# Patient Record
Sex: Female | Born: 1935 | Race: Black or African American | Hispanic: No | Smoking: Never smoker
Health system: Southern US, Community
[De-identification: ages and names within clinical notes are randomized; demographics above are authoritative.]

## PROBLEM LIST (undated history)

## (undated) DIAGNOSIS — N10 Acute tubulo-interstitial nephritis: Secondary | ICD-10-CM

## (undated) DIAGNOSIS — R55 Syncope and collapse: Secondary | ICD-10-CM

## (undated) DIAGNOSIS — I4891 Unspecified atrial fibrillation: Secondary | ICD-10-CM

## (undated) DIAGNOSIS — I10 Essential (primary) hypertension: Secondary | ICD-10-CM

## (undated) DIAGNOSIS — I639 Cerebral infarction, unspecified: Secondary | ICD-10-CM

## (undated) DIAGNOSIS — E785 Hyperlipidemia, unspecified: Secondary | ICD-10-CM

## (undated) DIAGNOSIS — F0390 Unspecified dementia without behavioral disturbance: Secondary | ICD-10-CM

## (undated) HISTORY — PX: NO PAST SURGERIES: SHX2092

---
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
## 2018-01-05 ENCOUNTER — Emergency Department (HOSPITAL_COMMUNITY): Payer: Medicare Other

## 2018-01-05 ENCOUNTER — Encounter (HOSPITAL_COMMUNITY): Payer: Self-pay

## 2018-01-05 ENCOUNTER — Emergency Department (HOSPITAL_COMMUNITY)
Admission: EM | Admit: 2018-01-05 | Discharge: 2018-01-05 | Disposition: A | Discharge status: Home / Self Care | Payer: Medicare Other | Attending: Emergency Medicine | Admitting: Emergency Medicine

## 2018-01-05 ENCOUNTER — Other Ambulatory Visit: Payer: Self-pay

## 2018-01-05 DIAGNOSIS — I10 Essential (primary) hypertension: Secondary | ICD-10-CM | POA: Insufficient documentation

## 2018-01-05 DIAGNOSIS — Z79899 Other long term (current) drug therapy: Secondary | ICD-10-CM | POA: Insufficient documentation

## 2018-01-05 DIAGNOSIS — Z7982 Long term (current) use of aspirin: Secondary | ICD-10-CM | POA: Diagnosis not present

## 2018-01-05 DIAGNOSIS — R55 Syncope and collapse: Secondary | ICD-10-CM | POA: Insufficient documentation

## 2018-01-05 HISTORY — DX: Cerebral infarction, unspecified: I63.9

## 2018-01-05 HISTORY — DX: Unspecified atrial fibrillation: I48.91

## 2018-01-05 HISTORY — DX: Essential (primary) hypertension: I10

## 2018-01-05 HISTORY — DX: Hyperlipidemia, unspecified: E78.5

## 2018-01-05 LAB — TYPE AND SCREEN
ABO/RH(D): O NEG
Antibody Screen: NEGATIVE

## 2018-01-05 LAB — BASIC METABOLIC PANEL
ANION GAP: 6 (ref 5–15)
BUN: 20 mg/dL (ref 8–23)
CALCIUM: 8.8 mg/dL — AB (ref 8.9–10.3)
CO2: 26 mmol/L (ref 22–32)
Chloride: 110 mmol/L (ref 98–111)
Creatinine, Ser: 1.17 mg/dL — ABNORMAL HIGH (ref 0.44–1.00)
GFR calc Af Amer: 48 mL/min — ABNORMAL LOW (ref >60)
GFR calc non Af Amer: 41 mL/min — ABNORMAL LOW (ref >60)
GLUCOSE: 123 mg/dL — AB (ref 70–99)
POTASSIUM: 3.7 mmol/L (ref 3.5–5.1)
Sodium: 142 mmol/L (ref 135–145)

## 2018-01-05 LAB — CBC
HEMATOCRIT: 35.4 % — AB (ref 36.0–46.0)
HEMOGLOBIN: 11.2 g/dL — AB (ref 12.0–15.0)
MCH: 31 pg (ref 26.0–34.0)
MCHC: 31.6 g/dL (ref 30.0–36.0)
MCV: 98.1 fL (ref 80.0–100.0)
Platelets: 199 10*3/uL (ref 150–400)
RBC: 3.61 MIL/uL — AB (ref 3.87–5.11)
RDW: 13.4 % (ref 11.5–15.5)
WBC: 6.1 10*3/uL (ref 4.0–10.5)
nRBC: 0 % (ref 0.0–0.2)

## 2018-01-05 LAB — ABO/RH: ABO/RH(D): O NEG

## 2018-01-05 LAB — CBG MONITORING, ED: Glucose-Capillary: 87 mg/dL (ref 70–99)

## 2018-01-05 LAB — TROPONIN I: Troponin I: <0.03 ng/mL (ref <0.03)

## 2018-01-05 MED ORDER — SODIUM CHLORIDE 0.9 % IV BOLUS (SEPSIS)
500.0000 mL | Freq: Once | INTRAVENOUS | Status: AC
  Administered 2018-01-05: 500 mL via INTRAVENOUS

## 2018-01-05 MED ORDER — SODIUM CHLORIDE 0.9 % IV SOLN
1000.0000 mL | INTRAVENOUS | Status: DC
  Administered 2018-01-05: 1000 mL via INTRAVENOUS

## 2018-01-05 NOTE — ED Notes (Signed)
Patient transported to CT 

## 2018-01-05 NOTE — Discharge Instructions (Signed)
Make sure to stay hydrated.  Follow up with your doctor next week to be rechecked.  Return to the ED for any recurrent episodes, feeling weak, feverish, short of breath or other concerning symptoms

## 2018-01-05 NOTE — ED Notes (Signed)
Pt ambulated to and from restroom with steady gait.

## 2018-01-05 NOTE — ED Provider Notes (Signed)
[facility] EMERGENCY DEPARTMENT Provider Note   CSN: [id number] Arrival date & time: 01/05/18  1125     History   Chief Complaint Chief Complaint  Patient presents with  . Loss of Consciousness    HPI Sierra Burch is a 84 y.o. female.  HPI Patient presents to the emergency room for evaluation after a syncopal episode.  Patient was outside at the ENT home coming in during the festivities.  Family states she may not have had much to eat this morning but she was having some peaches and they were going to get her some additional food.  Patient started to get weak.  Patient started trembling all over.  She then slumped over to the side.  No seizure-like activity noted.  Patient regained consciousness in a minute or so.  He may have vomited after the episode.  Patient is now alert and awake and has no complaints.  She denies any headache.  No chest pain or abdominal pain.  No numbness or weakness.  She has had similar episodes in the past Past Medical History:  Diagnosis Date  . A-fib (HCC)   . Hyperlipidemia   . Hypertension   . Stroke [facility])     There are no active problems to display for this patient.   History reviewed. No pertinent surgical history.   OB History   None      Home Medications    Prior to Admission medications   Medication Sig Start Date End Date Taking? Authorizing Provider  alendronate (FOSAMAX) 70 MG tablet Take 70 mg by mouth once a week. 12/11/17  Yes [provider]  amLODipine (NORVASC) 10 MG tablet Take 10 mg by mouth daily. 12/11/17  Yes [provider]  apixaban (ELIQUIS) 2.5 MG TABS tablet Take 2.5 mg by mouth 2 (two) times daily.    Yes [provider]  aspirin 81 MG chewable tablet Chew 81 mg by mouth daily.   Yes [provider]  atorvastatin (LIPITOR) 20 MG tablet Take 20 mg by mouth daily. 12/27/17  Yes [provider]  cetirizine (ZYRTEC) 10 MG tablet Take 10 mg by mouth daily.    Yes [provider]  Cholecalciferol (VITAMIN D) 2000 units CAPS Take 2,000 Units by mouth daily.   Yes [provider]  citalopram (CELEXA) 10 MG tablet Take 10 mg by mouth daily.   Yes [provider]  FIBER PO Take 1 tablet by mouth daily.   Yes [provider]  lisinopril (PRINIVIL,ZESTRIL) 5 MG tablet Take 5 mg by mouth daily. 12/13/17  Yes [provider]  Multiple Vitamin (MULTIVITAMIN WITH MINERALS) TABS tablet Take 1 tablet by mouth daily.   Yes [provider]    Family History No family history on file.  Social History Social History   Tobacco Use  . Smoking status: Not on file  Substance Use Topics  . Alcohol use: Not on file  . Drug use: Not on file     Allergies   Patient has no known allergies.   Review of Systems Review of Systems  All other systems reviewed and are negative.    Physical Exam Updated Vital Signs BP 117/65   Pulse 62   Temp 98.6 F (37 C) (Oral)   Resp 15   SpO2 98%   Physical Exam  Constitutional: She appears well-developed and well-nourished. No distress.  HENT:  Head: Normocephalic and atraumatic.  Right Ear: External ear normal.  Left Ear: External  ear normal.  Eyes: Conjunctivae are normal. Right eye exhibits no discharge. Left eye exhibits no discharge. No scleral icterus.  Neck: Neck supple. No tracheal deviation present.  Cardiovascular: Normal rate, regular rhythm and intact distal pulses.  Pulmonary/Chest: Effort normal and breath sounds normal. No stridor. No respiratory distress. She has no wheezes. She has no rales.  Abdominal: Soft. Bowel sounds are normal. She exhibits no distension. There is no tenderness. There is no rebound and no guarding.  Musculoskeletal: She exhibits no edema or tenderness.  Arthritic changes in the hands  Neurological: She is alert. She has normal strength. No cranial nerve deficit (no facial droop, extraocular movements intact, no  slurred speech) or sensory deficit. She exhibits normal muscle tone. She displays no seizure activity. Coordination normal.  Equal grip strength bilaterally, able to lift both arms and legs off the bed without evidence of pronator drift  Skin: Skin is warm and dry. No rash noted.  Psychiatric: She has a normal mood and affect.  Nursing note and vitals reviewed.    ED Treatments / Results  Labs (all labs ordered are listed, but only abnormal results are displayed) Labs Reviewed  BASIC METABOLIC PANEL - Abnormal; Notable for the following components:      Result Value   Glucose, Bld 123 (*)    Creatinine, Ser 1.17 (*)    Calcium 8.8 (*)    GFR calc non Af Amer 41 (*)    GFR calc Af Amer 48 (*)    All other components within normal limits  CBC - Abnormal; Notable for the following components:   RBC 3.61 (*)    Hemoglobin 11.2 (*)    HCT 35.4 (*)    All other components within normal limits  TROPONIN I  CBG MONITORING, ED  TYPE AND SCREEN  ABO/RH    EKG EKG Interpretation  Date/Time:  Saturday January 05 2018 11:28:36 EDT Ventricular Rate:  69 PR Interval:  178 QRS Duration: 82 QT Interval:  378 QTC Calculation: 405 R Axis:   63 Text Interpretation:  Normal sinus rhythm Normal ECG No old tracing to compare Confirmed by Linwood Dibbles [phone]) on 01/05/2018 12:40:28 PM   Radiology Dg Chest 2 View  Result Date: 01/05/2018 CLINICAL DATA:  Syncope EXAM: CHEST - 2 VIEW COMPARISON:  None. FINDINGS: Lungs are clear.  No pleural effusion or pneumothorax. Mild cardiomegaly. Degenerative changes of the visualized thoracolumbar spine. IMPRESSION: No evidence of acute cardiopulmonary disease. Electronically Signed   By: Charline Bills M.D.   On: 01/05/2018 14:29   Ct Head Wo Contrast  Result Date: 01/05/2018 CLINICAL DATA:  Pt presents for evaluation of syncope today. Pt was outside at [employer] homecoming, did not have anything to eat or drink this morning. Syncope was witnessed by  family, denies hitting head. Pt on eloquis EXAM: CT HEAD WITHOUT CONTRAST TECHNIQUE: Contiguous axial images were obtained from the base of the skull through the vertex without intravenous contrast. COMPARISON:  None. FINDINGS: Brain: No evidence of acute infarction, hemorrhage, hydrocephalus, extra-axial collection or mass lesion/mass effect. There is ventricular and sulcal enlargement reflecting moderate generalized atrophy. Patchy areas of white matter hypoattenuation are noted bilaterally consistent with moderate chronic microvascular ischemic change. Vascular: No hyperdense vessel or unexpected calcification. Skull: Normal. Negative for fracture or focal lesion. Sinuses/Orbits: Globes and orbits are unremarkable. Visualized sinuses and mastoid air cells are clear. Other: None. IMPRESSION: 1. No acute intracranial abnormalities.  No intracranial hemorrhage. 2. Atrophy and chronic microvascular ischemic change. Electronically  Signed   By: Amie Portland M.D.   On: 01/05/2018 13:52    Procedures Procedures (including critical care time)  Medications Ordered in ED Medications  sodium chloride 0.9 % bolus 500 mL (0 mLs Intravenous Stopped 01/05/18 1355)    Followed by  0.9 %  sodium chloride infusion (1,000 mLs Intravenous New Bag/Given 01/05/18 1342)     Initial Impression / Assessment and Plan / ED Course  I have reviewed the triage vital signs and the nursing notes.  Pertinent labs & imaging results that were available during my care of the patient were reviewed by me and considered in my medical decision making (see chart for details).  Clinical Course as of Jan 06 1524  Sat Jan 05, 2018  1520 Pt Is feeling fine.  She was able to walk around without difficulty.   [JK]    Clinical Course User Index [JK] Linwood Dibbles, MD    Patient presented to the emergency room after a syncopal episode.  In the emergency room she had no complaints.  Patient has no focal neurologic deficits.  No signs to  suggest stroke or TIA.  She is not having any trouble with chest pain.  I doubt acute coronary syndrome.  Laboratory tests do not show signs of any significant anemia or dehydration.  Symptoms may have been related to a vasovagal episode.  She was out in the sun had not had much to eat.  Patient symptoms did not return to the emergency room.  She was able to ambulate without any difficulty.  I discussed options with the family regarding overnight observation for cardiac monitoring versus close outpatient follow-up.  Patient had and her family feel comfortable going home.  Warning signs and precautions discussed.  Final Clinical Impressions(s) / ED Diagnoses   Final diagnoses:  Syncope and collapse    ED Discharge Orders    None       Linwood Dibbles, MD 01/05/18 1525

## 2018-01-05 NOTE — ED Triage Notes (Signed)
Pt presents for evaluation of syncope today. Pt was outside at [employer] home[employer], did not have anything to eat or drink this morning. Syncope was witnessed by family, denies hitting head. Pt is on blood thinner, elliquis.

## 2018-01-05 NOTE — ED Notes (Signed)
Got patient on the monitor did ekg shown to er doctor patient is resting with family at bedside and call bell in reach 

## 2018-10-04 ENCOUNTER — Encounter (HOSPITAL_COMMUNITY): Payer: Self-pay

## 2018-10-04 ENCOUNTER — Observation Stay (HOSPITAL_COMMUNITY)
Admission: EM | Admit: 2018-10-04 | Discharge: 2018-10-05 | Disposition: A | Discharge status: Home / Self Care | Payer: Medicare Other | Attending: Student in an Organized Health Care Education/Training Program | Admitting: Student in an Organized Health Care Education/Training Program

## 2018-10-04 ENCOUNTER — Other Ambulatory Visit: Payer: Self-pay

## 2018-10-04 ENCOUNTER — Emergency Department (HOSPITAL_COMMUNITY): Payer: Medicare Other

## 2018-10-04 DIAGNOSIS — W19XXXA Unspecified fall, initial encounter: Secondary | ICD-10-CM

## 2018-10-04 DIAGNOSIS — Z8673 Personal history of transient ischemic attack (TIA), and cerebral infarction without residual deficits: Secondary | ICD-10-CM

## 2018-10-04 DIAGNOSIS — Z66 Do not resuscitate: Secondary | ICD-10-CM | POA: Diagnosis not present

## 2018-10-04 DIAGNOSIS — Z1159 Encounter for screening for other viral diseases: Secondary | ICD-10-CM | POA: Insufficient documentation

## 2018-10-04 DIAGNOSIS — I4891 Unspecified atrial fibrillation: Secondary | ICD-10-CM

## 2018-10-04 DIAGNOSIS — Z23 Encounter for immunization: Secondary | ICD-10-CM | POA: Diagnosis not present

## 2018-10-04 DIAGNOSIS — Z7901 Long term (current) use of anticoagulants: Secondary | ICD-10-CM

## 2018-10-04 DIAGNOSIS — Z9181 History of falling: Secondary | ICD-10-CM

## 2018-10-04 DIAGNOSIS — W1809XA Striking against other object with subsequent fall, initial encounter: Secondary | ICD-10-CM

## 2018-10-04 DIAGNOSIS — N3000 Acute cystitis without hematuria: Secondary | ICD-10-CM | POA: Diagnosis present

## 2018-10-04 DIAGNOSIS — N390 Urinary tract infection, site not specified: Secondary | ICD-10-CM | POA: Diagnosis not present

## 2018-10-04 DIAGNOSIS — F0390 Unspecified dementia without behavioral disturbance: Secondary | ICD-10-CM

## 2018-10-04 DIAGNOSIS — Z8249 Family history of ischemic heart disease and other diseases of the circulatory system: Secondary | ICD-10-CM | POA: Diagnosis not present

## 2018-10-04 DIAGNOSIS — M6281 Muscle weakness (generalized): Secondary | ICD-10-CM | POA: Insufficient documentation

## 2018-10-04 DIAGNOSIS — R8271 Bacteriuria: Secondary | ICD-10-CM

## 2018-10-04 DIAGNOSIS — I10 Essential (primary) hypertension: Secondary | ICD-10-CM

## 2018-10-04 DIAGNOSIS — S0181XA Laceration without foreign body of other part of head, initial encounter: Secondary | ICD-10-CM

## 2018-10-04 DIAGNOSIS — E785 Hyperlipidemia, unspecified: Secondary | ICD-10-CM

## 2018-10-04 DIAGNOSIS — R2681 Unsteadiness on feet: Secondary | ICD-10-CM | POA: Insufficient documentation

## 2018-10-04 DIAGNOSIS — R55 Syncope and collapse: Principal | ICD-10-CM | POA: Diagnosis present

## 2018-10-04 DIAGNOSIS — Z79899 Other long term (current) drug therapy: Secondary | ICD-10-CM

## 2018-10-04 DIAGNOSIS — Y92002 Bathroom of unspecified non-institutional (private) residence single-family (private) house as the place of occurrence of the external cause: Secondary | ICD-10-CM | POA: Diagnosis not present

## 2018-10-04 DIAGNOSIS — S0101XA Laceration without foreign body of scalp, initial encounter: Secondary | ICD-10-CM | POA: Diagnosis present

## 2018-10-04 DIAGNOSIS — Z7982 Long term (current) use of aspirin: Secondary | ICD-10-CM | POA: Diagnosis not present

## 2018-10-04 DIAGNOSIS — Y939 Activity, unspecified: Secondary | ICD-10-CM | POA: Insufficient documentation

## 2018-10-04 DIAGNOSIS — J189 Pneumonia, unspecified organism: Secondary | ICD-10-CM

## 2018-10-04 DIAGNOSIS — R2689 Other abnormalities of gait and mobility: Secondary | ICD-10-CM | POA: Diagnosis not present

## 2018-10-04 DIAGNOSIS — W1839XA Other fall on same level, initial encounter: Secondary | ICD-10-CM | POA: Diagnosis not present

## 2018-10-04 HISTORY — DX: Unspecified dementia, unspecified severity, without behavioral disturbance, psychotic disturbance, mood disturbance, and anxiety: F03.90

## 2018-10-04 HISTORY — DX: Syncope and collapse: R55

## 2018-10-04 LAB — CBC
HCT: 38.8 % (ref 36.0–46.0)
Hemoglobin: 12.4 g/dL (ref 12.0–15.0)
MCH: 31.6 pg (ref 26.0–34.0)
MCHC: 32 g/dL (ref 30.0–36.0)
MCV: 99 fL (ref 80.0–100.0)
Platelets: 191 10*3/uL (ref 150–400)
RBC: 3.92 MIL/uL (ref 3.87–5.11)
RDW: 14.1 % (ref 11.5–15.5)
WBC: 7.6 10*3/uL (ref 4.0–10.5)
nRBC: 0 % (ref 0.0–0.2)

## 2018-10-04 LAB — URINALYSIS, ROUTINE W REFLEX MICROSCOPIC
Bilirubin Urine: NEGATIVE
Glucose, UA: NEGATIVE mg/dL
Hgb urine dipstick: NEGATIVE
Ketones, ur: NEGATIVE mg/dL
Nitrite: NEGATIVE
Protein, ur: NEGATIVE mg/dL
Specific Gravity, Urine: 1.012 (ref 1.005–1.030)
pH: 7 (ref 5.0–8.0)

## 2018-10-04 LAB — BASIC METABOLIC PANEL
Anion gap: 14 (ref 5–15)
BUN: 20 mg/dL (ref 8–23)
CO2: 20 mmol/L — ABNORMAL LOW (ref 22–32)
Calcium: 9.1 mg/dL (ref 8.9–10.3)
Chloride: 107 mmol/L (ref 98–111)
Creatinine, Ser: 0.84 mg/dL (ref 0.44–1.00)
GFR calc Af Amer: >60 mL/min (ref >60)
GFR calc non Af Amer: >60 mL/min (ref >60)
Glucose, Bld: 77 mg/dL (ref 70–99)
Potassium: 4.1 mmol/L (ref 3.5–5.1)
Sodium: 141 mmol/L (ref 135–145)

## 2018-10-04 LAB — SARS CORONAVIRUS 2 BY RT PCR (HOSPITAL ORDER, PERFORMED IN ~~LOC~~ HOSPITAL LAB): SARS Coronavirus 2: NEGATIVE

## 2018-10-04 MED ORDER — ATORVASTATIN CALCIUM 10 MG PO TABS
20.0000 mg | ORAL_TABLET | Freq: Every day | ORAL | Status: DC
  Administered 2018-10-04: 20 mg via ORAL
  Filled 2018-10-04 (×2): qty 2

## 2018-10-04 MED ORDER — LACTATED RINGERS IV SOLN
INTRAVENOUS | Status: AC
  Administered 2018-10-04: 14:00:00 via INTRAVENOUS

## 2018-10-04 MED ORDER — ACETAMINOPHEN 325 MG PO TABS
650.0000 mg | ORAL_TABLET | Freq: Four times a day (QID) | ORAL | Status: DC | PRN
  Administered 2018-10-05: 650 mg via ORAL
  Filled 2018-10-04: qty 2

## 2018-10-04 MED ORDER — PNEUMOCOCCAL VAC POLYVALENT 25 MCG/0.5ML IJ INJ
0.5000 mL | INJECTION | INTRAMUSCULAR | Status: AC
  Administered 2018-10-05: 0.5 mL via INTRAMUSCULAR
  Filled 2018-10-04: qty 0.5

## 2018-10-04 MED ORDER — ASPIRIN 81 MG PO CHEW
81.0000 mg | CHEWABLE_TABLET | Freq: Every day | ORAL | Status: DC
  Administered 2018-10-04 – 2018-10-05 (×2): 81 mg via ORAL
  Filled 2018-10-04 (×2): qty 1

## 2018-10-04 MED ORDER — VITAMIN D 25 MCG (1000 UNIT) PO TABS
5000.0000 [IU] | ORAL_TABLET | Freq: Every day | ORAL | Status: DC
  Administered 2018-10-04 – 2018-10-05 (×2): 5000 [IU] via ORAL
  Filled 2018-10-04 (×3): qty 5

## 2018-10-04 MED ORDER — LIDOCAINE-EPINEPHRINE (PF) 2 %-1:200000 IJ SOLN
10.0000 mL | Freq: Once | INTRAMUSCULAR | Status: AC
  Administered 2018-10-04: 10 mL via INTRADERMAL
  Filled 2018-10-04: qty 20

## 2018-10-04 MED ORDER — PROMETHAZINE HCL 25 MG PO TABS: 12.5000 mg | ORAL_TABLET | Freq: Four times a day (QID) | ORAL | Status: DC | PRN

## 2018-10-04 MED ORDER — APIXABAN 2.5 MG PO TABS
2.5000 mg | ORAL_TABLET | Freq: Two times a day (BID) | ORAL | Status: DC
  Administered 2018-10-04 – 2018-10-05 (×3): 2.5 mg via ORAL
  Filled 2018-10-04 (×4): qty 1

## 2018-10-04 MED ORDER — CEPHALEXIN 250 MG PO CAPS
500.0000 mg | ORAL_CAPSULE | Freq: Once | ORAL | Status: AC
  Administered 2018-10-04: 500 mg via ORAL
  Filled 2018-10-04: qty 2

## 2018-10-04 MED ORDER — SENNOSIDES-DOCUSATE SODIUM 8.6-50 MG PO TABS: 1.0000 | ORAL_TABLET | Freq: Every evening | ORAL | Status: DC | PRN

## 2018-10-04 MED ORDER — LISINOPRIL 5 MG PO TABS
5.0000 mg | ORAL_TABLET | Freq: Every day | ORAL | Status: DC
  Administered 2018-10-05: 5 mg via ORAL
  Filled 2018-10-04: qty 1

## 2018-10-04 MED ORDER — ACETAMINOPHEN 650 MG RE SUPP: 650.0000 mg | Freq: Four times a day (QID) | RECTAL | Status: DC | PRN

## 2018-10-04 MED ORDER — AMLODIPINE BESYLATE 5 MG PO TABS
5.0000 mg | ORAL_TABLET | Freq: Every day | ORAL | Status: DC
  Administered 2018-10-04 – 2018-10-05 (×2): 5 mg via ORAL
  Filled 2018-10-04 (×2): qty 1

## 2018-10-04 MED ORDER — TETANUS-DIPHTH-ACELL PERTUSSIS 5-2.5-18.5 LF-MCG/0.5 IM SUSP
0.5000 mL | Freq: Once | INTRAMUSCULAR | Status: AC
  Administered 2018-10-04: 0.5 mL via INTRAMUSCULAR
  Filled 2018-10-04: qty 0.5

## 2018-10-04 NOTE — ED Notes (Signed)
ED Provider at bedside. 

## 2018-10-04 NOTE — H&P (Addendum)
Date: 10/04/2018               Patient Name:  Sierra Burch MRN: [id number]  DOB: 1930-07-06 Age / Sex: 85 y.o., female   PCP: System, Pcp Not In         Medical Service: Internal Medicine Teaching Service         Attending Physician: Dr. Inez CatalinaMullen, Emily B, MD    First Contact: Dr. Barbaraann FasterSteen Pager: [phone]  Second Contact: Dr. Alinda MoneyMelvin  Pager: [phone]       After Hours (After 5p/  First Contact Pager: [phone]  weekends / holidays): Second Contact Pager: [phone]   Chief Complaint: Post a fall  History of Present Illness:  Sierra Burch is a 85 year old female with dementia, atrial fibrillation, hypertension, hyperlipidemia who presented post a fall that occurred this morning while she was in the bathroom.  The patient went to the bathroom and fell backwards while standing at the sink and hit the back of her head on the tub. Her fall was witnessed by her daughter Sierra Burch.  The patient apparently lost consciousness for a few seconds. She stated that she recalls having some lightheadedness and shortness of breath prior to the episode this morning. There was no tremors, bowel or bladder incontinence. The patient does not recall what happened. She states that she is having a headache after hitting her head.  The baseline uses a cane to ambulate, needs assistance to dress herself and shower, can eat on her own, cannot cook her own. Usually sees [facility] Jonell [facility]aura Ekka [facility], last seen earlier this month. Two years ago the patient started having dementia which causes her to not recognize daughter's neighborhood, not knowing the current date or city.   Called patient's daughters who provided me with additional information. The patient has had several other falls over the past one year: July 2019 (blacked out after sweeping driveway), October 2019 (after walking 2 miles-dehydrated), December 2019 (couldn't stabilize herself in a chair and flipped over). The patient's daughter mentions that the  patient has been drinking water, but she along with the patient's pcp are worried about her drinking enough. Although patient's daughter remind patient to drink water the patient oftern gets irritated when they do that.   Per EMR, the patient had a similar type of episode in October 2019 during which time she was seen in the ED found to not have any signs and symptoms of a stroke or TIA and was thought to be due to a vasovagal episode from being out in the sun and not eating. The patient has had 4 other falls    Patient denies any fever/chills, nausea, vomiting, neck pain, back pain, abdominal pain, dizziness, chest pain, shortness of breath, muscular weakness, numbness, tingling, dysuria, burning with urination.  The patient was placed in a c-collar and brought in by EMS. In the ED, the patient is afebrile, pulse rate ranging 50-60s, respirations in the low 20s, mildly hypertensive 140-150/70-80s, respirating at 100% on room air.  Meds:  Current Meds  Medication Sig  . alendronate (FOSAMAX) 70 MG tablet Take 70 mg by mouth once a week. Tuesday  . amLODipine (NORVASC) 5 MG tablet Take 5 mg by mouth daily.   Marland Kitchen. apixaban (ELIQUIS) 2.5 MG TABS tablet Take 2.5 mg by mouth 2 (two) times daily.   Marland Kitchen. aspirin 81 MG chewable tablet Chew 81 mg by mouth daily.  Marland Kitchen. atorvastatin (LIPITOR) 20 MG tablet Take 20 mg by mouth daily.  .Marland Kitchen  cetirizine (ZYRTEC) 10 MG tablet Take 10 mg by mouth at bedtime.   . Cholecalciferol (VITAMIN D) 2000 units CAPS Take 5,000 Units by mouth daily.   Marland Kitchen. lisinopril (PRINIVIL,ZESTRIL) 5 MG tablet Take 5 mg by mouth daily.    Allergies: Allergies as of 10/04/2018  . (No Known Allergies)   Past Medical History:  Diagnosis Date  . A-fib (HCC)   . Dementia (HCC)   . Hyperlipidemia   . Hypertension   . Stroke [facility] [facility](HCC)     Family History:   HTN-mother and father  Loss adjuster, charteredCancer-sister   Social History:  Lives with daughter Timmie [place]queline [place] in [place], usually at an adult daycare  High school education. Used to fix computers at [employer] 6 daughters, 4 live in [place] and 2 in [place]  Never smoke  Does not drugs or etoh   Review of Systems:  A complete ROS was negative except as per HPI.  Physical Exam: Blood pressure (!) 154/74, pulse 75, temperature 97.9 F (36.6 C), temperature source Oral, resp. rate (!) 21, height 5\' 2"  (1.575 m), weight 59 kg, SpO2 100 %.   Physical Exam  Constitutional: She is oriented to person, place, and time. She appears well-developed and well-nourished. No distress.  HENT:  Head: Normocephalic.  3cm linear laceration at posterior aspect of head. Skin has been stapled together. Small amount of dried blood at edges. Patient feels it is sore to touch.   Eyes: Conjunctivae are normal.  Cardiovascular: Normal rate, regular rhythm and normal heart sounds.  Respiratory: Effort normal and breath sounds normal. No respiratory distress. She has no wheezes.  GI: Soft. Bowel sounds are normal. She exhibits no distension. There is no abdominal tenderness.  Musculoskeletal:        General: No edema.     Comments: 5/5 upper and lower extremity strength. Difficulty to do right upper extremity abduction.  Neurological: She is alert and oriented to person, place, and time. No cranial nerve deficit. Coordination normal.  Forgetful about details discussed few minutes ago. Slowed to do basic arithmetic and counting backwards. Sensation intact upper and lower extremities.  Skin: She is not diaphoretic. No erythema.  Psychiatric: She has a normal mood and affect. Her behavior is normal. Judgment and thought content normal.   Labs: CBC: WBC 7.6, hemoglobin 12.4, hematocrit 38, PLT 191 BMP: Na 141, K4.1, bicarb 20, Cr 0.84, AG 14 UA: Negative nitrite, moderate leukocytes, rare bacteria Urine culture pending SARS CoV2 pending  EKG: personally reviewed my interpretation is sinus rhtyhm with rates approx 60, no st or t wave changes. Junctional rhythm.   CXR: Not done  Assessment & Plan by Problem: Active Problems:   Syncope  Sierra Burch is a 85 year old female with afib, htn, hx of stroke, dementia who presented post a fall and brief loss of consciousness this morning.   Syncope Patient had a syncopal episode this morning after a fall in her bathroom which caused her to hit the back of her head.  She lost consciousness for a few minutes.  CT head without any acute intracranial abnormalities.  CT cervical spine showing C5-6, C6-7 with degenerative changes.   Per imaging studies patient is less likely to have neurological syncope. Orthostatic vital signs positive from sitting to standing position indicating possible dehydration. Patient is currently taking diphenhydramine which is a centrally acting agent that can be contributing to patient's syncope. Patient does not have known diabetes or significant spinal cord injury that will lead to autonomic cause of  her syncope. No provoking factors (heat, noxious stimuli, fear, etc) that can contribute to the patient having situational vs vasovagal syncope. It is also possible that bacteriuria maybe contributing to her syncope to some extent.   -Cardiac monitoring -LR 176ml/hr -Hold diphenhydramine -Requested most recent office visit progress note from Dr. Ralene Ok to be faxed  -PT and OT consulted  Asymptomatic Bacteriuria Patient with UA showing negative nitrites, moderate leukocytes, rare bacteria.  Urine culture is pending.  The patient has been afebrile. Without dysuria, abdominal pain, or burning with urination  -pending urine culture  Atrial fibrillation The patient is not on any rate controlling medications. CHADSVASC 6 points, 9.7% stroke risk per year.  -Continue Eliquis 2.5 mg twice daily  Hyperlipidemia  -Continue atorvastatin 20mg  daily  Hypertension Patient's blood pressure since admission has ranged 140-150/70-80s.   -Continue lisinopril 5 mg  -Continue amlodipine 5 mg    History of CVA  -Continue aspirin 81 mg daily -Continue atorvastatin 20 mg daily  Goals of Care  Has been discussed with patient and daughters per pcp.  -DNR  Dispo: Admit patient to Observation with expected length of stay less than 2 midnights.  SignedLars Mage, MD 10/04/2018, 12:23 PM  Pager: [phone]

## 2018-10-04 NOTE — ED Notes (Signed)
Patient transported to CT 

## 2018-10-04 NOTE — Progress Notes (Signed)
   10/04/18 1658  Clinical Encounter Type  Visited With Patient and family together  Visit Type Initial  Referral From Nurse  Spiritual Encounters  Spiritual Needs Emotional  Stress Factors  Patient Stress Factors Loss of control;Health changes  Family Stress Factors None identified   Responded to Epic consult request.  Pt and her daughter in room, pt has a now-deceased nephew who played for the [employer], which is reason for her lovely blue nail polish.  Pt very appreciative of visit and was pleasant and has a playful personality.  Pt declines having spiritual needs at this time.  Let pt and daughter know that chaplain will be available 24/7.  Myra Gianotti resident, [phone]

## 2018-10-04 NOTE — ED Notes (Signed)
Pt walked to bathroom after orthostatic vital signs, no complaints at this time

## 2018-10-04 NOTE — ED Provider Notes (Signed)
[facility] Note   CSN: [id number] Arrival date & time: 10/04/18  [id number]    History   Chief Complaint Chief Complaint  Patient presents with  . Fall    hematoma to back of head    HPI Colin InaLarah Schiavi is a 85 y.o. female.     HPI Patient presents to the emergency room for evaluation after a syncopal event and fall.  Patient had a witnessed fall from home this morning.  Patient's daughter her saw her getting up this morning to go to the bathroom.  She was at the sink when she suddenly started falling backward.  She hit her head on the back of the tub.  Daughter thinks the patient had brief loss of consciousness.  Her eyes were closed.  Patient regained consciousness and is back to her baseline.  Patient herself does not exactly remember what happened but she is having a headache.  Denies any chest pain or shortness of breath.  She has not had any issues with vomiting or diarrhea.  She has not felt sick recently.  No known fevers or chills.  She denies any trouble moving her arms or legs. Past Medical History:  Diagnosis Date  . A-fib (HCC)   . Dementia (HCC)   . Hyperlipidemia   . Hypertension   . Stroke [facility](HCC)     There are no active problems to display for this patient.   History reviewed. No pertinent surgical history.   OB History   No obstetric history on file.      Home Medications    Prior to Admission medications   Medication Sig Start Date End Date Taking? Authorizing Provider  alendronate (FOSAMAX) 70 MG tablet Take 70 mg by mouth once a week. Tuesday 12/11/17  Yes [provider]  amLODipine (NORVASC) 5 MG tablet Take 5 mg by mouth daily.  12/11/17  Yes [provider]  apixaban (ELIQUIS) 2.5 MG TABS tablet Take 2.5 mg by mouth 2 (two) times daily.    Yes [provider]  aspirin 81 MG chewable tablet Chew 81 mg by mouth daily.   Yes [provider]  atorvastatin (LIPITOR) 20 MG  tablet Take 20 mg by mouth daily. 12/27/17  Yes [provider]  cetirizine (ZYRTEC) 10 MG tablet Take 10 mg by mouth at bedtime.    Yes [provider]  Cholecalciferol (VITAMIN D) 2000 units CAPS Take 5,000 Units by mouth daily.    Yes [provider]  lisinopril (PRINIVIL,ZESTRIL) 5 MG tablet Take 5 mg by mouth daily. 12/13/17  Yes [provider]    Family History History reviewed. No pertinent family history.  Social History Social History   Tobacco Use  . Smoking status: Never Smoker  . Smokeless tobacco: Never Used  Substance Use Topics  . Alcohol use: Not Currently  . Drug use: Not Currently     Allergies   Patient has no known allergies.   Review of Systems Review of Systems  All other systems reviewed and are negative.    Physical Exam Updated Vital Signs BP (!) 147/73   Pulse 60   Temp 97.9 F (36.6 C) (Oral)   Resp 17   Ht 1.575 m (5\' 2" )   Wt 59 kg   SpO2 100%   BMI 23.78 kg/m   Physical Exam Vitals signs and nursing note reviewed.  Constitutional:      General: She is not in acute distress.  Appearance: She is well-developed.     Comments: Elderly, frail  HENT:     Head: Normocephalic.     Comments: Laceration at the base of the posterior occiput    Right Ear: External ear normal.     Left Ear: External ear normal.  Eyes:     General: No scleral icterus.       Right eye: No discharge.        Left eye: No discharge.     Conjunctiva/sclera: Conjunctivae normal.  Neck:     Musculoskeletal: Neck supple.     Trachea: No tracheal deviation.  Cardiovascular:     Rate and Rhythm: Normal rate and regular rhythm.  Pulmonary:     Effort: Pulmonary effort is normal. No respiratory distress.     Breath sounds: Normal breath sounds. No stridor. No wheezing or rales.  Abdominal:     General: Bowel sounds are normal. There is no distension.     Palpations: Abdomen is soft.     Tenderness: There is no abdominal  tenderness. There is no guarding or rebound.  Musculoskeletal:        General: No tenderness.     Right shoulder: She exhibits no tenderness, no bony tenderness and no swelling.     Left shoulder: She exhibits no tenderness, no bony tenderness and no swelling.     Right wrist: She exhibits no tenderness, no bony tenderness and no swelling.     Left wrist: She exhibits no tenderness, no bony tenderness and no swelling.     Right hip: She exhibits normal range of motion, no tenderness, no bony tenderness and no swelling.     Left hip: She exhibits normal range of motion, no tenderness and no bony tenderness.     Right ankle: She exhibits no swelling. No tenderness.     Left ankle: She exhibits no swelling. No tenderness.     Cervical back: She exhibits bony tenderness. She exhibits no tenderness and no swelling.     Thoracic back: She exhibits no tenderness, no bony tenderness and no swelling.     Lumbar back: She exhibits no tenderness, no bony tenderness and no swelling.  Skin:    General: Skin is warm and dry.     Findings: No rash.  Neurological:     Mental Status: She is alert.     Cranial Nerves: No cranial nerve deficit (no facial droop, extraocular movements intact, no slurred speech).     Sensory: No sensory deficit.     Motor: No abnormal muscle tone or seizure activity.     Coordination: Coordination normal.      ED Treatments / Results  Labs (all labs ordered are listed, but only abnormal results are displayed) Labs Reviewed  BASIC METABOLIC PANEL - Abnormal; Notable for the following components:      Result Value   CO2 20 (*)    All other components within normal limits  URINALYSIS, ROUTINE W REFLEX MICROSCOPIC - Abnormal; Notable for the following components:   Leukocytes,Ua MODERATE (*)    Bacteria, UA RARE (*)    All other components within normal limits  URINE CULTURE  CBC    EKG EKG Interpretation  Date/Time:  Friday October 04 2018 07:12:44 EDT Ventricular  Rate:  56 PR Interval:    QRS Duration: 89 QT Interval:  429 QTC Calculation: 414 R Axis:   53 Text Interpretation:  Sinus rhythm Consider left ventricular hypertrophy No significant change since last tracing Confirmed  by Linwood DibblesKnapp, Ameliyah Sarno [phone]([id number]) on 10/04/2018 7:15:00 AM   Radiology Ct Head Wo Contrast  Result Date: 10/04/2018 CLINICAL DATA:  Status post fall. Loss of consciousness. Initial encounter. EXAM: CT HEAD WITHOUT CONTRAST CT CERVICAL SPINE WITHOUT CONTRAST TECHNIQUE: Multidetector CT imaging of the head and cervical spine was performed following the standard protocol without intravenous contrast. Multiplanar CT image reconstructions of the cervical spine were also generated. COMPARISON:  Head CT 01/05/2018. FINDINGS: CT HEAD FINDINGS Brain: No evidence of acute infarction, hemorrhage, hydrocephalus, extra-axial collection or mass lesion/mass effect. Atrophy, chronic microvascular ischemic change remote right caudate and left corona radiata lacunar infarcts noted. Vascular: Extensive atherosclerosis. Skull: No fracture or focal lesion. Sinuses/Orbits: Status post cataract surgery.  Otherwise negative. Other: None. CT CERVICAL SPINE FINDINGS Alignment: Maintained. Skull base and vertebrae: No acute fracture. No primary bone lesion or focal pathologic process. Status post left laminotomy at C6. 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 Soft tissues and spinal canal: No prevertebral fluid or swelling. No visible canal hematoma. Disc levels: Loss of disc space height and endplate spurring are worst at C5-6 and C6-7. Upper chest: Clear. Other: None. IMPRESSION: No acute abnormality head or cervical spine. Atrophy and chronic microvascular ischemic change. 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 Electronically Signed   By: Drusilla Kannerhomas  Dalessio M.D.   On: 10/04/2018 10:28   Ct Cervical Spine Wo Contrast  Result Date: 10/04/2018 CLINICAL DATA:  Status post fall. Loss of  consciousness. Initial encounter. EXAM: CT HEAD WITHOUT CONTRAST CT CERVICAL SPINE WITHOUT CONTRAST TECHNIQUE: Multidetector CT imaging of the head and cervical spine was performed following the standard protocol without intravenous contrast. Multiplanar CT image reconstructions of the cervical spine were also generated. COMPARISON:  Head CT 01/05/2018. FINDINGS: CT HEAD FINDINGS Brain: No evidence of acute infarction, hemorrhage, hydrocephalus, extra-axial collection or mass lesion/mass effect. Atrophy, chronic microvascular ischemic change remote right caudate and left corona radiata lacunar infarcts noted. Vascular: Extensive atherosclerosis. Skull: No fracture or focal lesion. Sinuses/Orbits: Status post cataract surgery.  Otherwise negative. Other: None. CT CERVICAL SPINE FINDINGS Alignment: Maintained. Skull base and vertebrae: No acute fracture. No primary bone lesion or focal pathologic process. Status post left laminotomy at C6. 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 Soft tissues and spinal canal: No prevertebral fluid or swelling. No visible canal hematoma. Disc levels: Loss of disc space height and endplate spurring are worst at C5-6 and C6-7. Upper chest: Clear. Other: None. IMPRESSION: No acute abnormality head or cervical spine. Atrophy and chronic microvascular ischemic change. 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 Electronically Signed   By: Drusilla Kannerhomas  Dalessio M.D.   On: 10/04/2018 10:28    Procedures .Marland Kitchen.Laceration Repair  Date/Time: 10/04/2018 8:11 AM Performed by: Linwood DibblesKnapp, Mattia Liford, MD Authorized by: Linwood DibblesKnapp, Consepcion Utt, MD   Consent:    Consent obtained:  Verbal   Consent given by:  Patient   Risks discussed:  Infection, need for additional repair, pain, poor cosmetic result and poor wound healing   Alternatives discussed:  No treatment and delayed treatment Universal protocol:    Procedure explained and questions answered to patient or proxy's satisfaction: yes      Relevant documents present and verified: yes     Test results available and properly labeled: yes     Imaging studies available: yes     Required blood products, implants, devices, and special equipment available: yes     Site/side marked: yes     Immediately prior to procedure,  a time out was called: yes     Patient identity confirmed:  Verbally with patient Anesthesia (see MAR for exact dosages):    Anesthesia method:  Local infiltration   Local anesthetic:  Lidocaine 1% WITH epi Laceration details:    Location:  Scalp   Scalp location:  Occipital   Length (cm):  2   Depth (mm):  3 Repair type:    Repair type:  Simple Exploration:    Wound exploration: entire depth of wound probed and visualized     Wound extent: no underlying fracture noted     Contaminated: no   Treatment:    Area cleansed with:  Shur-Clens   Amount of cleaning:  Standard   Irrigation method:  Pressure wash   Visualized foreign bodies/material removed: no   Skin repair:    Repair method:  Staples   Number of staples:  4 Approximation:    Approximation:  Close Post-procedure details:    Dressing:  Open (no dressing)   Patient tolerance of procedure:  Tolerated well, no immediate complications   (including critical care time)  Medications Ordered in ED Medications  cephALEXin (KEFLEX) capsule 500 mg (has no administration in time range)  Tdap (BOOSTRIX) injection 0.5 mL (0.5 mLs Intramuscular Given 10/04/18 0910)  lidocaine-EPINEPHrine (XYLOCAINE W/EPI) 2 %-1:200000 (PF) injection 10 mL (10 mLs Intradermal Given by Other 10/04/18 0800)     Initial Impression / Assessment and Plan / ED Course  I have reviewed the triage vital signs and the nursing notes.  Pertinent labs & imaging results that were available during my care of the patient were reviewed by me and considered in my medical decision making (see chart for details).  Clinical Course as of Oct 03 1053  Fri Oct 04, 2018  1038 CT scans  without acute findings.  Labs notable for UTI otherwise unremarkable   [JK]  1047 No signs of serious injury associated with her fall.  Unclear whether this was a syncopal event versus a fall after becoming unsteady and then having brief loss of consciousness associated with her head injury.  Patient is alert and awake and has remained stable.  No neurologic deficits to suggest stroke.  Discussed with family regarding observation admission versus discharge and close outpatient follow-up.  Patient's family want to discuss this further and will decide shortly   [JK]  1053 Discussed options with daughter again.  She would prefer observation admission   [JK]    Clinical Course User Index [JK] Linwood DibblesKnapp, Luetta Piazza, MD     Patient presents to the ED for evaluation after a syncopal episode at home resulting in a scalp laceration.  Laboratory tests and CT scans without any concerning findings.  Etiology of her syncopal episode however is unclear.  While on the cardiac monitor here in the ED she did have a brief episode of junctional rhythm was not significantly bradycardic.  Patient certainly has comorbidities and is at risk for cardiac etiology for her syncope.  Will consult with medical service for admission overnight cardiac monitoring.  Final Clinical Impressions(s) / ED Diagnoses   Final diagnoses:  Fall, initial encounter  Laceration of scalp, initial encounter  Acute cystitis without hematuria     Linwood DibblesKnapp, Tymira Horkey, MD 10/04/18 1055

## 2018-10-04 NOTE — ED Notes (Signed)
ED TO INPATIENT HANDOFF REPORT  ED Nurse Name and Phone #: Osborne CascoNadia,  RN   S Name/Age/Gender Colin InaLarah Umbaugh 85 y.o. female Room/Bed: 013C/013C  Code Status   Code Status: DNR  Home/SNF/Other Home Patient oriented to: self, place and situation Is this baseline? Yes   Triage Complete: Triage complete  Chief Complaint fall, hematoma  Triage Note Pt arrives with [facility] from home c/o fall this morning in the bathroom. Pt woke up, when to restroom when she fell backwards standing at the sink and hit the back of her head on the tub and suffered head lac. Pt has hx of dementia, a& o x3  (disoriented to time and situation at times) Per pt's daughter, pt is at her baseline. Pt was placed in C-collar by EMS; pt denies any neck or back pain. Pt's daughter is at bedside.  EMS vitals:  CBG 104 HR 56 BP 150/90 95% O2 on RA   Allergies No Known Allergies  Level of Care/Admitting Diagnosis ED Disposition    ED Disposition Condition Comment   Admit  [facility]ospital [facility] [[id number]]  Level of Care: Telemetry Medical [104]  Covid Evaluation: Asymptomatic Screening Protocol (No Symptoms)  Diagnosis: Syncope [206001]  Admitting Physician: Inez CatalinaMULLEN, EMILY B [[id number]]  Attending Physician: Inez CatalinaMULLEN, EMILY B [[id number]]  PT Class (Do Not Modify): Observation [104]  PT Acc Code (Do Not Modify): Observation [10022]       B Medical/Surgery History Past Medical History:  Diagnosis Date  . A-fib (HCC)   . Dementia (HCC)   . Hyperlipidemia   . Hypertension   . Stroke [facility](HCC)    History reviewed. No pertinent surgical history.   A IV Location/Drains/Wounds Patient Lines/Drains/Airways Status   Active Line/Drains/Airways    Name:   Placement date:   Placement time:   Site:   Days:   Peripheral IV 10/04/18 Right Antecubital   10/04/18    1127    Antecubital   less than 1   External Urinary Catheter   10/04/18    0916    -   less than 1          Intake/Output Last 24  hours No intake or output data in the 24 hours ending 10/04/18 1347  Labs/Imaging Results for orders placed or performed during the hospital encounter of 10/04/18 (from the past 48 hour(s))  CBC     Status: None   Collection Time: 10/04/18  7:35 AM  Result Value Ref Range   WBC 7.6 4.0 - 10.5 K/uL   RBC 3.92 3.87 - 5.11 MIL/uL   Hemoglobin 12.4 12.0 - 15.0 g/dL   HCT 96.038.8 45.436.0 - 09.846.0 %   MCV 99.0 80.0 - 100.0 fL   MCH 31.6 26.0 - 34.0 pg   MCHC 32.0 30.0 - 36.0 g/dL   RDW 11.914.1 14.711.5 - 82.915.5 %   Platelets 191 150 - 400 K/uL   nRBC 0.0 0.0 - 0.2 %    Comment: Performed at [facility], [address] [place], [place] [address]  Basic metabolic panel     Status: Abnormal   Collection Time: 10/04/18  7:35 AM  Result Value Ref Range   Sodium 141 135 - 145 mmol/L   Potassium 4.1 3.5 - 5.1 mmol/L   Chloride 107 98 - 111 mmol/L   CO2 20 (L) 22 - 32 mmol/L   Glucose, Bld 77 70 - 99 mg/dL   BUN 20 8 - 23 mg/dL  Creatinine, Ser 0.84 0.44 - 1.00 mg/dL   Calcium 9.1 8.9 - 10.3 mg/dL   GFR calc non Af Amer >60 >60 mL/min   GFR calc Af Amer >60 >60 mL/min   Anion gap 14 5 - 15    Comment: Performed at [facility] [address] [place], [place] [address]  Urinalysis, Routine w reflex microscopic     Status: Abnormal   Collection Time: 10/04/18  7:36 AM  Result Value Ref Range   Color, Urine YELLOW YELLOW   APPearance CLEAR CLEAR   Specific Gravity, Urine 1.012 1.005 - 1.030   pH 7.0 5.0 - 8.0   Glucose, UA NEGATIVE NEGATIVE mg/dL   Hgb urine dipstick NEGATIVE NEGATIVE   Bilirubin Urine NEGATIVE NEGATIVE   Ketones, ur NEGATIVE NEGATIVE mg/dL   Protein, ur NEGATIVE NEGATIVE mg/dL   Nitrite NEGATIVE NEGATIVE   Leukocytes,Ua MODERATE (A) NEGATIVE   RBC / HPF 0-5 0 - 5 RBC/hpf   WBC, UA 21-50 0 - 5 WBC/hpf   Bacteria, UA RARE (A) NONE SEEN   Squamous Epithelial / LPF 11-20 0 - 5   Mucus PRESENT     Comment: Performed at [facility], [place] [address]  [place],  [address]  SARS Coronavirus 2 (CEPHEID - Performed in [place] hospital lab), Hosp Order     Status: None   Collection Time: 10/04/18 11:17 AM   Specimen: Nasopharyngeal Swab  Result Value Ref Range   SARS Coronavirus 2 NEGATIVE NEGATIVE    Comment: (NOTE) If result is NEGATIVE SARS-CoV-2 target nucleic acids are NOT DETECTED. The SARS-CoV-2 RNA is generally detectable in upper and lower  respiratory specimens during the acute phase of infection. The lowest  concentration of SARS-CoV-2 viral copies this assay can detect is 250  copies / mL. A negative result does not preclude SARS-CoV-2 infection  and should not be used as the sole basis for treatment or other  patient management decisions.  A negative result may occur with  improper specimen collection / handling, submission of specimen other  than nasopharyngeal swab, presence of viral mutation(s) within the  areas targeted by this assay, and inadequate number of viral copies  (<250 copies / mL). A negative result must be combined with clinical  observations, patient history, and epidemiological information. If result is POSITIVE SARS-CoV-2 target nucleic acids are DETECTED. The SARS-CoV-2 RNA is generally detectable in upper and lower  respiratory specimens dur ing the acute phase of infection.  Positive  results are indicative of active infection with SARS-CoV-2.  Clinical  correlation with patient history and other diagnostic information is  necessary to determine patient infection status.  Positive results do  not rule out bacterial infection or co-infection with other viruses. If result is PRESUMPTIVE POSTIVE SARS-CoV-2 nucleic acids MAY BE PRESENT.   A presumptive positive result was obtained on the submitted specimen  and confirmed on repeat testing.  While 2019 novel coronavirus  (SARS-CoV-2) nucleic acids may be present in the submitted sample  additional confirmatory testing may be necessary for  epidemiological  and / or clinical management purposes  to differentiate between  SARS-CoV-2 and other Sarbecovirus currently known to infect humans.  If clinically indicated additional testing with an alternate test  methodology [phone]) is advised. The SARS-CoV-2 RNA is generally  detectable in upper and lower respiratory sp ecimens during the acute  phase of infection. The expected result is Negative. Fact Sheet for Patients:  StrictlyIdeas.no Fact Sheet for Healthcare  Providers: https://pope.com/https://www.fda.gov/media/136313/download This test is not yet approved or cleared by the [place] FDA and has been authorized for detection and/or diagnosis of SARS-CoV-2 by FDA under an Emergency Use Authorization (EUA).  This EUA will remain in effect (meaning this test can be used) for the duration of the COVID-19 declaration under Section 564(b)(1) of the Act, 21 U.S.C. section 360bbb-3(b)(1), unless the authorization is terminated or revoked sooner. Performed at [facility], [address] [place], [place] [address]    Ct Head Wo Contrast  Result Date: 10/04/2018 CLINICAL DATA:  Status post fall. Loss of consciousness. Initial encounter. EXAM: CT HEAD WITHOUT CONTRAST CT CERVICAL SPINE WITHOUT CONTRAST TECHNIQUE: Multidetector CT imaging of the head and cervical spine was performed following the standard protocol without intravenous contrast. Multiplanar CT image reconstructions of the cervical spine were also generated. COMPARISON:  Head CT 01/05/2018. FINDINGS: CT HEAD FINDINGS Brain: No evidence of acute infarction, hemorrhage, hydrocephalus, extra-axial collection or mass lesion/mass effect. Atrophy, chronic microvascular ischemic change remote right caudate and left corona radiata lacunar infarcts noted. Vascular: Extensive atherosclerosis. Skull: No fracture or focal lesion. Sinuses/Orbits: Status post cataract surgery.  Otherwise negative. Other: None. CT  CERVICAL SPINE FINDINGS Alignment: Maintained. Skull base and vertebrae: No acute fracture. No primary bone lesion or focal pathologic process. Status post left laminotomy at C6. 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 Soft tissues and spinal canal: No prevertebral fluid or swelling. No visible canal hematoma. Disc levels: Loss of disc space height and endplate spurring are worst at C5-6 and C6-7. Upper chest: Clear. Other: None. IMPRESSION: No acute abnormality head or cervical spine. Atrophy and chronic microvascular ischemic change. 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 Electronically Signed   By: Drusilla Kannerhomas  Dalessio M.D.   On: 10/04/2018 10:28   Ct Cervical Spine Wo Contrast  Result Date: 10/04/2018 CLINICAL DATA:  Status post fall. Loss of consciousness. Initial encounter. EXAM: CT HEAD WITHOUT CONTRAST CT CERVICAL SPINE WITHOUT CONTRAST TECHNIQUE: Multidetector CT imaging of the head and cervical spine was performed following the standard protocol without intravenous contrast. Multiplanar CT image reconstructions of the cervical spine were also generated. COMPARISON:  Head CT 01/05/2018. FINDINGS: CT HEAD FINDINGS Brain: No evidence of acute infarction, hemorrhage, hydrocephalus, extra-axial collection or mass lesion/mass effect. Atrophy, chronic microvascular ischemic change remote right caudate and left corona radiata lacunar infarcts noted. Vascular: Extensive atherosclerosis. Skull: No fracture or focal lesion. Sinuses/Orbits: Status post cataract surgery.  Otherwise negative. Other: None. CT CERVICAL SPINE FINDINGS Alignment: Maintained. Skull base and vertebrae: No acute fracture. No primary bone lesion or focal pathologic process. Status post left laminotomy at C6. 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 Soft tissues and spinal canal: No prevertebral fluid or swelling. No visible canal hematoma. Disc levels: Loss of disc space  height and endplate spurring are worst at C5-6 and C6-7. Upper chest: Clear. Other: None. IMPRESSION: No acute abnormality head or cervical spine. Atrophy and chronic microvascular ischemic change. 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 Electronically Signed   By: Drusilla Kannerhomas  Dalessio M.D.   On: 10/04/2018 10:28    Pending Labs Unresulted Labs (From admission, onward)    Start     Ordered   10/05/18 0500  Basic metabolic panel  Tomorrow morning,   R     10/04/18 1222   10/05/18 0500  CBC  Tomorrow morning,   R     10/04/18 1222   10/04/18 1047  Urine Culture  Once,   STAT     10/04/18 1046          Vitals/Pain Today's Vitals   10/04/18 1200 10/04/18 1215 10/04/18 1245 10/04/18 1315  BP: (!) 154/74 (!) 167/81 (!) 142/70 (!) 148/66  Pulse: 75 (!) 41 61 61  Resp: (!) 21 (!) 21 18 (!) 21  Temp:      TempSrc:      SpO2: 100% 100% 100% 100%  Weight:      Height:      PainSc:        Isolation Precautions No active isolations  Medications Medications  apixaban (ELIQUIS) tablet 2.5 mg (has no administration in time range)  aspirin chewable tablet 81 mg (81 mg Oral Given 10/04/18 1233)  atorvastatin (LIPITOR) tablet 20 mg (has no administration in time range)  cholecalciferol (VITAMIN D3) tablet 5,000 Units (5,000 Units Oral Given 10/04/18 1233)  amLODipine (NORVASC) tablet 5 mg (has no administration in time range)  lisinopril (ZESTRIL) tablet 5 mg (has no administration in time range)  acetaminophen (TYLENOL) tablet 650 mg (has no administration in time range)    Or  acetaminophen (TYLENOL) suppository 650 mg (has no administration in time range)  senna-docusate (Senokot-S) tablet 1 tablet (has no administration in time range)  promethazine (PHENERGAN) tablet 12.5 mg (has no administration in time range)  lactated ringers infusion (has no administration in time range)  Tdap (BOOSTRIX) injection 0.5 mL (0.5 mLs Intramuscular Given 10/04/18 0910)  lidocaine-EPINEPHrine (XYLOCAINE W/EPI)  2 %-1:200000 (PF) injection 10 mL (10 mLs Intradermal Given by Other 10/04/18 0800)  cephALEXin (KEFLEX) capsule 500 mg (500 mg Oral Given 10/04/18 1057)    Mobility walks with device High fall risk      R Recommendations: See Admitting Provider Note  Report given to:   Additional Notes:

## 2018-10-04 NOTE — ED Triage Notes (Addendum)
Pt arrives with [facility] from home c/o fall this morning in the bathroom. Pt woke up, when to restroom when she fell backwards standing at the sink and hit the back of her head on the tub and suffered head lac. Pt has hx of dementia, a& o x3  (disoriented to time and situation at times) Per pt's daughter, pt is at her baseline. Pt was placed in C-collar by EMS; pt denies any neck or back pain. Pt's daughter is at bedside.  EMS vitals:  CBG 104 HR 56 BP 150/90 95% O2 on RA

## 2018-10-05 ENCOUNTER — Observation Stay (HOSPITAL_BASED_OUTPATIENT_CLINIC_OR_DEPARTMENT_OTHER)
Admission: EM | Admit: 2018-10-05 | Discharge: 2018-10-07 | Disposition: A | Discharge status: Home / Self Care | Payer: Medicare Other | Source: Home / Self Care | Attending: Emergency Medicine | Admitting: Emergency Medicine

## 2018-10-05 ENCOUNTER — Emergency Department (HOSPITAL_COMMUNITY): Payer: Medicare Other

## 2018-10-05 ENCOUNTER — Other Ambulatory Visit: Payer: Self-pay

## 2018-10-05 ENCOUNTER — Encounter (HOSPITAL_COMMUNITY): Payer: Self-pay | Admitting: Emergency Medicine

## 2018-10-05 DIAGNOSIS — F0390 Unspecified dementia without behavioral disturbance: Secondary | ICD-10-CM | POA: Diagnosis present

## 2018-10-05 DIAGNOSIS — N10 Acute tubulo-interstitial nephritis: Secondary | ICD-10-CM | POA: Insufficient documentation

## 2018-10-05 DIAGNOSIS — R55 Syncope and collapse: Secondary | ICD-10-CM | POA: Diagnosis present

## 2018-10-05 DIAGNOSIS — Z23 Encounter for immunization: Secondary | ICD-10-CM | POA: Diagnosis not present

## 2018-10-05 DIAGNOSIS — R2681 Unsteadiness on feet: Secondary | ICD-10-CM | POA: Insufficient documentation

## 2018-10-05 DIAGNOSIS — Z66 Do not resuscitate: Secondary | ICD-10-CM | POA: Insufficient documentation

## 2018-10-05 DIAGNOSIS — Z7982 Long term (current) use of aspirin: Secondary | ICD-10-CM | POA: Insufficient documentation

## 2018-10-05 DIAGNOSIS — Z8673 Personal history of transient ischemic attack (TIA), and cerebral infarction without residual deficits: Secondary | ICD-10-CM | POA: Insufficient documentation

## 2018-10-05 DIAGNOSIS — E785 Hyperlipidemia, unspecified: Secondary | ICD-10-CM | POA: Insufficient documentation

## 2018-10-05 DIAGNOSIS — N390 Urinary tract infection, site not specified: Secondary | ICD-10-CM

## 2018-10-05 DIAGNOSIS — Z7901 Long term (current) use of anticoagulants: Secondary | ICD-10-CM | POA: Insufficient documentation

## 2018-10-05 DIAGNOSIS — R2689 Other abnormalities of gait and mobility: Secondary | ICD-10-CM | POA: Insufficient documentation

## 2018-10-05 DIAGNOSIS — M6281 Muscle weakness (generalized): Secondary | ICD-10-CM | POA: Insufficient documentation

## 2018-10-05 DIAGNOSIS — I4891 Unspecified atrial fibrillation: Secondary | ICD-10-CM | POA: Insufficient documentation

## 2018-10-05 DIAGNOSIS — I6782 Cerebral ischemia: Secondary | ICD-10-CM | POA: Insufficient documentation

## 2018-10-05 DIAGNOSIS — W19XXXA Unspecified fall, initial encounter: Secondary | ICD-10-CM | POA: Diagnosis present

## 2018-10-05 DIAGNOSIS — Z8249 Family history of ischemic heart disease and other diseases of the circulatory system: Secondary | ICD-10-CM | POA: Insufficient documentation

## 2018-10-05 DIAGNOSIS — Z1159 Encounter for screening for other viral diseases: Secondary | ICD-10-CM | POA: Diagnosis not present

## 2018-10-05 DIAGNOSIS — I10 Essential (primary) hypertension: Secondary | ICD-10-CM | POA: Insufficient documentation

## 2018-10-05 DIAGNOSIS — Z79899 Other long term (current) drug therapy: Secondary | ICD-10-CM | POA: Insufficient documentation

## 2018-10-05 LAB — COMPREHENSIVE METABOLIC PANEL
ALT: 16 U/L (ref 0–44)
AST: 17 U/L (ref 15–41)
Albumin: 3.7 g/dL (ref 3.5–5.0)
Alkaline Phosphatase: 70 U/L (ref 38–126)
Anion gap: 9 (ref 5–15)
BUN: 21 mg/dL (ref 8–23)
CO2: 27 mmol/L (ref 22–32)
Calcium: 9.6 mg/dL (ref 8.9–10.3)
Chloride: 102 mmol/L (ref 98–111)
Creatinine, Ser: 0.86 mg/dL (ref 0.44–1.00)
GFR calc Af Amer: >60 mL/min (ref >60)
GFR calc non Af Amer: >60 mL/min (ref >60)
Glucose, Bld: 114 mg/dL — ABNORMAL HIGH (ref 70–99)
Potassium: 4.2 mmol/L (ref 3.5–5.1)
Sodium: 138 mmol/L (ref 135–145)
Total Bilirubin: 0.9 mg/dL (ref 0.3–1.2)
Total Protein: 8 g/dL (ref 6.5–8.1)

## 2018-10-05 LAB — PROTIME-INR
INR: 1.2 (ref 0.8–1.2)
Prothrombin Time: 14.9 seconds (ref 11.4–15.2)

## 2018-10-05 LAB — CBC WITH DIFFERENTIAL/PLATELET
Abs Immature Granulocytes: 0.05 10*3/uL (ref 0.00–0.07)
Basophils Absolute: 0 10*3/uL (ref 0.0–0.1)
Basophils Relative: 0 %
Eosinophils Absolute: 0 10*3/uL (ref 0.0–0.5)
Eosinophils Relative: 0 %
HCT: 39.9 % (ref 36.0–46.0)
Hemoglobin: 13 g/dL (ref 12.0–15.0)
Immature Granulocytes: 0 %
Lymphocytes Relative: 4 %
Lymphs Abs: 0.7 10*3/uL (ref 0.7–4.0)
MCH: 31 pg (ref 26.0–34.0)
MCHC: 32.6 g/dL (ref 30.0–36.0)
MCV: 95.2 fL (ref 80.0–100.0)
Monocytes Absolute: 0.8 10*3/uL (ref 0.1–1.0)
Monocytes Relative: 5 %
Neutro Abs: 13.8 10*3/uL — ABNORMAL HIGH (ref 1.7–7.7)
Neutrophils Relative %: 91 %
Platelets: 212 10*3/uL (ref 150–400)
RBC: 4.19 MIL/uL (ref 3.87–5.11)
RDW: 14 % (ref 11.5–15.5)
WBC: 15.3 10*3/uL — ABNORMAL HIGH (ref 4.0–10.5)
nRBC: 0 % (ref 0.0–0.2)

## 2018-10-05 LAB — SARS CORONAVIRUS 2 BY RT PCR (HOSPITAL ORDER, PERFORMED IN ~~LOC~~ HOSPITAL LAB): SARS Coronavirus 2: NEGATIVE

## 2018-10-05 LAB — CBC
HCT: 36.2 % (ref 36.0–46.0)
Hemoglobin: 12 g/dL (ref 12.0–15.0)
MCH: 31.3 pg (ref 26.0–34.0)
MCHC: 33.1 g/dL (ref 30.0–36.0)
MCV: 94.5 fL (ref 80.0–100.0)
Platelets: 214 10*3/uL (ref 150–400)
RBC: 3.83 MIL/uL — ABNORMAL LOW (ref 3.87–5.11)
RDW: 13.8 % (ref 11.5–15.5)
WBC: 9.7 10*3/uL (ref 4.0–10.5)
nRBC: 0 % (ref 0.0–0.2)

## 2018-10-05 LAB — BASIC METABOLIC PANEL
Anion gap: 9 (ref 5–15)
BUN: 13 mg/dL (ref 8–23)
CO2: 26 mmol/L (ref 22–32)
Calcium: 9.2 mg/dL (ref 8.9–10.3)
Chloride: 102 mmol/L (ref 98–111)
Creatinine, Ser: 0.79 mg/dL (ref 0.44–1.00)
GFR calc Af Amer: >60 mL/min (ref >60)
GFR calc non Af Amer: >60 mL/min (ref >60)
Glucose, Bld: 110 mg/dL — ABNORMAL HIGH (ref 70–99)
Potassium: 3.5 mmol/L (ref 3.5–5.1)
Sodium: 137 mmol/L (ref 135–145)

## 2018-10-05 LAB — LACTIC ACID, PLASMA: Lactic Acid, Venous: 1.1 mmol/L (ref 0.5–1.9)

## 2018-10-05 LAB — APTT: aPTT: 33 seconds (ref 24–36)

## 2018-10-05 MED ORDER — ACETAMINOPHEN 325 MG PO TABS
650.0000 mg | ORAL_TABLET | Freq: Once | ORAL | Status: AC
  Administered 2018-10-05: 650 mg via ORAL
  Filled 2018-10-05: qty 2

## 2018-10-05 MED ORDER — SODIUM CHLORIDE 0.9 % IV SOLN
1.0000 g | INTRAVENOUS | Status: DC
  Administered 2018-10-05: 22:00:00 1 g via INTRAVENOUS
  Filled 2018-10-05: qty 10

## 2018-10-05 MED ORDER — SODIUM CHLORIDE 0.9 % IV BOLUS
1000.0000 mL | Freq: Once | INTRAVENOUS | Status: AC
  Administered 2018-10-05: 22:00:00 1000 mL via INTRAVENOUS

## 2018-10-05 NOTE — ED Provider Notes (Signed)
[facility] EMERGENCY DEPARTMENT Provider Note   CSN: [id number] Arrival date & time: 10/05/18  1941   LEVEL 5 CAVEAT - DEMENTIA   History   Chief Complaint Chief Complaint  Patient presents with   Fever    HPI Sierra Burch is a 85 y.o. female.     HPI  85 year old female presents with fever.  History is mostly taken from daughter.  Was discharged from the hospital at about 5 PM after syncope, fall, and head laceration.  Feeling generally weak and they had a hard time getting her into the house.  Multiple temperature checks because the patient felt warm and had a couple at about 100 and one of them 100.8.  No meds given.  They were told to return if temperature was above 100.4.  No new symptoms except that the patient had to urinate multiple times and only an hour or so.  No pain.  Past Medical History:  Diagnosis Date   A-fib ([place])    Dementia ([place])    Hyperlipidemia    Hypertension    Stroke [facility])     Patient Active Problem List   Diagnosis Date Noted   Syncope 10/04/2018   Fall    Scalp laceration     History reviewed. No pertinent surgical history.   OB History   No obstetric history on file.      Home Medications    Prior to Admission medications   Medication Sig Start Date End Date Taking? Authorizing Provider  alendronate (FOSAMAX) 70 MG tablet Take 70 mg by mouth once a week. Tuesday 12/11/17   [provider]  amLODipine (NORVASC) 5 MG tablet Take 5 mg by mouth daily.  12/11/17   [provider]  apixaban (ELIQUIS) 2.5 MG TABS tablet Take 2.5 mg by mouth 2 (two) times daily.     [provider]  aspirin 81 MG chewable tablet Chew 81 mg by mouth daily.    [provider]  atorvastatin (LIPITOR) 20 MG tablet Take 20 mg by mouth daily. 12/27/17   [provider]  Cholecalciferol (VITAMIN D) 2000 units CAPS Take 5,000 Units by mouth daily.     [provider]  lisinopril  (PRINIVIL,ZESTRIL) 5 MG tablet Take 5 mg by mouth daily. 12/13/17   [provider]    Family History No family history on file.  Social History Social History   Tobacco Use   Smoking status: Never Smoker   Smokeless tobacco: Never Used  Substance Use Topics   Alcohol use: Not Currently   Drug use: Not Currently     Allergies   Patient has no known allergies.   Review of Systems Review of Systems  Unable to perform ROS: Dementia  Constitutional: Positive for fever.  Respiratory: Positive for cough (chronic, unchanged).   Gastrointestinal: Negative for vomiting.     Physical Exam Updated Vital Signs BP (!) 154/94    Pulse 86    Temp (!) 100.9 F (38.3 C) (Rectal)    Resp 17    SpO2 96%   Physical Exam Vitals signs and nursing note reviewed.  Constitutional:      General: She is not in acute distress.    Appearance: She is well-developed. She is not ill-appearing or diaphoretic.  HENT:     Head: Normocephalic and atraumatic.     Right Ear: External ear normal.     Left Ear: External ear normal.     Nose: Nose normal.  Eyes:  General:        Right eye: No discharge.        Left eye: No discharge.  Cardiovascular:     Rate and Rhythm: Normal rate and regular rhythm.     Heart sounds: Normal heart sounds.  Pulmonary:     Effort: Pulmonary effort is normal.     Breath sounds: Normal breath sounds.  Abdominal:     Palpations: Abdomen is soft.     Tenderness: There is no abdominal tenderness.  Skin:    General: Skin is warm and dry.  Neurological:     Mental Status: She is alert.  Psychiatric:        Mood and Affect: Mood is not anxious.      ED Treatments / Results  Labs (all labs ordered are listed, but only abnormal results are displayed) Labs Reviewed  COMPREHENSIVE METABOLIC PANEL - Abnormal; Notable for the following components:      Result Value   Glucose, Bld 114 (*)    All other components within normal limits  CBC WITH  DIFFERENTIAL/PLATELET - Abnormal; Notable for the following components:   WBC 15.3 (*)    Neutro Abs 13.8 (*)    All other components within normal limits  CULTURE, BLOOD (ROUTINE X 2)  CULTURE, BLOOD (ROUTINE X 2)  URINE CULTURE  SARS CORONAVIRUS 2 (HOSPITAL ORDER, PERFORMED IN [facility])  LACTIC ACID, PLASMA  APTT  PROTIME-INR  LACTIC ACID, PLASMA  URINALYSIS, ROUTINE W REFLEX MICROSCOPIC    EKG EKG Interpretation  Date/Time:  Saturday October 05 2018 22:06:35 EDT Ventricular Rate:  89 PR Interval:    QRS Duration: 89 QT Interval:  356 QTC Calculation: 434 R Axis:   67 Text Interpretation:  Sinus rhythm Probable anteroseptal infarct, old Artifact in lead(s) I II aVR aVL aVF V1 V2 Interpretation limited secondary to artifact Confirmed by Pricilla LovelessGoldston, Jaymz Traywick [phone]([id number]) on 10/05/2018 10:18:48 PM   Radiology Ct Head Wo Contrast  Result Date: 10/04/2018 CLINICAL DATA:  Status post fall. Loss of consciousness. Initial encounter. EXAM: CT HEAD WITHOUT CONTRAST CT CERVICAL SPINE WITHOUT CONTRAST TECHNIQUE: Multidetector CT imaging of the head and cervical spine was performed following the standard protocol without intravenous contrast. Multiplanar CT image reconstructions of the cervical spine were also generated. COMPARISON:  Head CT 01/05/2018. FINDINGS: CT HEAD FINDINGS Brain: No evidence of acute infarction, hemorrhage, hydrocephalus, extra-axial collection or mass lesion/mass effect. Atrophy, chronic microvascular ischemic change remote right caudate and left corona radiata lacunar infarcts noted. Vascular: Extensive atherosclerosis. Skull: No fracture or focal lesion. Sinuses/Orbits: Status post cataract surgery.  Otherwise negative. Other: None. CT CERVICAL SPINE FINDINGS Alignment: Maintained. Skull base and vertebrae: No acute fracture. No primary bone lesion or focal pathologic process. Status post left laminotomy at C6. Ossification of the posterior longitudinal ligament from  C5-6 to C6-7 noted. Soft tissues and spinal canal: No prevertebral fluid or swelling. No visible canal hematoma. Disc levels: Loss of disc space height and endplate spurring are worst at C5-6 and C6-7. Upper chest: Clear. Other: None. IMPRESSION: No acute abnormality head or cervical spine. Atrophy and chronic microvascular ischemic change. 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 Electronically Signed   By: Drusilla Kannerhomas  Dalessio M.D.   On: 10/04/2018 10:28   Ct Cervical Spine Wo Contrast  Result Date: 10/04/2018 CLINICAL DATA:  Status post fall. Loss of consciousness. Initial encounter. EXAM: CT HEAD WITHOUT CONTRAST CT CERVICAL SPINE WITHOUT CONTRAST TECHNIQUE: Multidetector CT imaging of the head and cervical spine was  performed following the standard protocol without intravenous contrast. Multiplanar CT image reconstructions of the cervical spine were also generated. COMPARISON:  Head CT 01/05/2018. FINDINGS: CT HEAD FINDINGS Brain: No evidence of acute infarction, hemorrhage, hydrocephalus, extra-axial collection or mass lesion/mass effect. Atrophy, chronic microvascular ischemic change remote right caudate and left corona radiata lacunar infarcts noted. Vascular: Extensive atherosclerosis. Skull: No fracture or focal lesion. Sinuses/Orbits: Status post cataract surgery.  Otherwise negative. Other: None. CT CERVICAL SPINE FINDINGS Alignment: Maintained. Skull base and vertebrae: No acute fracture. No primary bone lesion or focal pathologic process. Status post left laminotomy at C6. 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 Soft tissues and spinal canal: No prevertebral fluid or swelling. No visible canal hematoma. Disc levels: Loss of disc space height and endplate spurring are worst at C5-6 and C6-7. Upper chest: Clear. Other: None. IMPRESSION: No acute abnormality head or cervical spine. Atrophy and chronic microvascular ischemic change. 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  Electronically Signed   By: Drusilla Kannerhomas  Dalessio M.D.   On: 10/04/2018 10:28   Dg Chest Port 1 View  Result Date: 10/05/2018 CLINICAL DATA:  85 year old female with fever. EXAM: PORTABLE CHEST 1 VIEW COMPARISON:  Chest radiograph dated 01/05/2018 FINDINGS: The lungs are clear. There is no pleural effusion or pneumothorax. Stable cardiomegaly. Atherosclerotic calcification of the aortic arch. Degenerative changes of the spine and shoulders. No acute osseous pathology. IMPRESSION: No active disease. Electronically Signed   By: Elgie CollardArash  Radparvar M.D.   On: 10/05/2018 22:13    Procedures Procedures (including critical care time)  Medications Ordered in ED Medications  cefTRIAXone (ROCEPHIN) 1 g in sodium chloride 0.9 % 100 mL IVPB (1 g Intravenous New Bag/Given 10/05/18 2219)  acetaminophen (TYLENOL) tablet 650 mg (has no administration in time range)  sodium chloride 0.9 % bolus 1,000 mL (1,000 mLs Intravenous New Bag/Given 10/05/18 2219)     Initial Impression / Assessment and Plan / ED Course  I have reviewed the triage vital signs and the nursing notes.  Pertinent labs & imaging results that were available during my care of the patient were reviewed by me and considered in my medical decision making (see chart for details).        Given significant increase in WBC, fever, and brief tachycardia, she meets SIRS criteria.  Lactate is okay.  Generally weak but no altered mental status.  I had discussion with daughter who prefers to come back into the hospital for observation.  Internal medicine consulted for admission.  Colin InaLarah Ebner was evaluated in Emergency Department on 10/05/2018 for the symptoms described in the history of present illness. She was evaluated in the context of the global COVID-19 pandemic, which necessitated consideration that the patient might be at risk for infection with the SARS-CoV-2 virus that causes COVID-19. Institutional protocols and algorithms that pertain to the  evaluation of patients at risk for COVID-19 are in a state of rapid change based on information released by regulatory bodies including the CDC and federal and state organizations. These policies and algorithms were followed during the patient's care in the ED.   Final Clinical Impressions(s) / ED Diagnoses   Final diagnoses:  Febrile urinary tract infection    ED Discharge Orders    None       Pricilla LovelessGoldston, Ezariah Nace, MD 10/05/18 2329

## 2018-10-05 NOTE — H&P (Addendum)
Date: 10/06/2018               Patient Name:  Sierra Burch MRN: [id number]  DOB: 11/13/1930 Age / Sex: 85 y.o., female   PCP: System, Pcp Not In         Medical Service: Internal Medicine Teaching Service         Attending Physician: Dr. Oswaldo DoneVincent, Marquita Palmsuncan Thomas, *    First Contact: Dr. Meredith ModyStein Pager: [phone]  Second Contact: Dr. Alinda MoneyMelvin Pager: [phone]       After Hours (After 5p/  First Contact Pager: [phone]  weekends / holidays): Second Contact Pager: [phone]   Chief Complaint: fever  History of Present Illness: 85 year old woman with a history of dementia admitted to our service on 7/24 for orthostatic hypotension causing syncope, discharged on 7/25 after IV fluids and resolution of the orthostatics, now presenting back to the emergency department only a few hours after discharge with a new onset fever.  The patient reports feeling well currently, has a little lower abdominal pain, denies nausea or vomiting, denies flank pain.  She is accompanied by her daughter, who reports that the patient was still unsteady on her feet at home, she was worried that she was going to fall.  Patient had a sensation of urinary urgency, try to get to the bathroom by herself several times yesterday and often unable to pass urine.  Daughter noticed that her skin felt warm, checked her temperature and noted to be 100.4.  Patient denies chest pain, no shortness of breath, no cough.  She has some mild right shoulder pain.  Daughter notes that she has been generally tired and fatigued appearing for several days.  Another daughter over the phone tells us that she has a history of altered functional status which in the past has been attributed to urinary tract infections as well.   Meds:  Current Meds  Medication Sig  . alendronate (FOSAMAX) 70 MG tablet Take 70 mg by mouth once a week. Tuesday  . amLODipine (NORVASC) 5 MG tablet Take 5 mg by mouth daily.   Marland Kitchen. apixaban (ELIQUIS) 2.5 MG TABS tablet Take 2.5 mg  by mouth 2 (two) times daily.   Marland Kitchen. aspirin 81 MG chewable tablet Chew 81 mg by mouth daily.  Marland Kitchen. atorvastatin (LIPITOR) 20 MG tablet Take 20 mg by mouth daily.  . Cholecalciferol (VITAMIN D) 2000 units CAPS Take 5,000 Units by mouth daily.   Marland Kitchen. lisinopril (PRINIVIL,ZESTRIL) 5 MG tablet Take 5 mg by mouth daily.    Allergies: Allergies as of 10/05/2018  . (No Known Allergies)   Past Medical History:  Diagnosis Date  . A-fib (HCC)   . Dementia (HCC)   . Hyperlipidemia   . Hypertension   . Stroke [facility](HCC)     Family History: Hypertension in her mother and father.  Sister has a history of cancer.  Social History: Lives with daughter, Adela LankJacqueline, [place].  Review of Systems: A complete ROS was negative except as per HPI.   Physical Exam: Blood pressure 127/69, pulse 77, temperature (!) 100.9 F (38.3 C), temperature source Rectal, resp. rate (!) 22, SpO2 96 %. General: Resting in bed comfortably.  No acute distress. Neck: thin, no thyromegaly, normal JVP Cardiac: Heart regular rate rhythm.     Trace pitting edema bilateral lower extremities, equal on both sides Pulmonary: Lung sounds clear.  No crackles appreciated.  No wheezing. Abdomen: Abdomen non-distended.  No tenderness to palpation Neurological: Alert, oriented, conversational, memory  deficits noted, full strength in the upper and lower extremities Psych: Appropriate mood and affect, not depressed or anxious appearing Skin: Warm, no rashes Ext: Normal joints without deformity   Assessment & Plan by Problem:  Principal Problem:   UTI (urinary tract infection) Active Problems:   Syncope   Fall   Dementia [facility])  85 year old person with chronic dementia admitted with fever and decreased functional status due to a likely urinary tract infection.  Urinary tract infection: T-max 100.8 F in the ED along with leukocytosis to 15.  Patient appears well with no signs of sepsis.  Lactic acid normal.  She has minimal symptoms  of dysuria, only urinary frequency.  No signs of pyelonephritis besides the fever.  Eating and drinking well.  Urine culture from yesterday positive for Proteus which we initially thought was asymptomatic bacteriuria.  Now seems more likely that she has enough inflammation to cause symptoms. - Blood cultures pending, will follow these up tomorrow to rule out Proteus bacteremia -Treatment with ceftriaxone.  Follow-up urine culture sensitivities.   Atrial fibrillation: Paroxysmal, currently in normal sinus rhythm.  Stable chronic issue. - Continue Eliquis 2.5 mg twice daily  Hypertension: Stable blood pressure currently.  Will recheck orthostatics given recent history of hypotension.  Continue to hold home amlodipine and lisinopril.  Likely can restart these agents in a few days once we treat the urinary tract infection.  Ischemic vascular disease: Stable.  No signs of new acute stroke.  Plan to continue aspirin and atorvastatin  CODE STATUS: DO NOT RESUSCITATE  IV fluids: None Diet: Heart healthy DVT prophylaxis: Eliquis  Dispo: Admit patient to Observation with expected length of stay less than 2 midnights.    Signed: Mitzi Hansen, MD 10/06/2018, 12:49 AM    Internal Medicine Attending:   I saw and examined the patient. I reviewed the resident's note and I agree with the resident's findings and plan as documented in the resident's note.  I have made necessary changes to the above note.  Lalla Brothers, MD FACP

## 2018-10-05 NOTE — Care Management Obs Status (Signed)
[facility] NOTIFICATION   Patient Details  Name: Sierra Burch MRN: [id number] Date of Birth: Jun 07, 1930   Medicare Observation Status Notification Given:  Yes    Carles Collet, RN 10/05/2018, 4:00 PM

## 2018-10-05 NOTE — Progress Notes (Signed)
   Subjective: Patient reports doing well this morning.  Her daughter is in the room on exam.  Patient denies abdominal pain, dysuria, fever, chill, and feeling lightheaded.   Objective:  Vital signs in last 24 hours: Vitals:   10/04/18 1553 10/04/18 2202 10/05/18 0635 10/05/18 0922  BP: (!) 137/104 139/77 (!) 152/76 139/69  Pulse: 65 72 72   Resp: 18 16 16    Temp: 98.1 F (36.7 C) 98.1 F (36.7 C) 97.7 F (36.5 C)   TempSrc: Oral Oral Oral   SpO2: 98% 99% 97%   Weight:      Height:       Physical Exam Constitutional:      General: She is not in acute distress. Cardiovascular:     Rate and Rhythm: Normal rate and regular rhythm.     Heart sounds: No murmur.  Pulmonary:     Effort: Pulmonary effort is normal.     Breath sounds: Normal breath sounds.  Abdominal:     General: Bowel sounds are normal.     Palpations: Abdomen is soft.  Neurological:     Mental Status: She is alert.     Assessment/Plan:  Active Problems:   Syncope   Fall   Scalp laceration  Ms. Mcquaig is a 85 year old female with past medical history of hypertension hyperlipidemia history of CVA approximately 5 to 6 years ago, A. Fib (on Eliquis, not on rate controlling medication), and dementia who presented after a fall in her bathroom which also hit her head.  CT negative for acute intracranial abnormalities.  #Syncope Patient monitored on telemetry and in sinus rhythm overnight.  Orthostatic  lying to sitting, drop in systolic pressure 604>540.  Patient with good p.o. intake this morning and spoke to daughter about plan to remind her mother to drink more fluids throughout the day.  -PT/ OT -Hold diphenhydramine -Cardiac monitoring  #Asymptomatic bacteriuria UA from yesterday showed negative nitrites moderate leukocytes rare bacteria.  Urine culture with preliminary results of 100,000 gram-negative rods.  Patient is asymptomatic.  Denies dysuria abdominal pain fevers chills, and WBC is normal.   Without other signs/symptoms likely a chronic colonization and patient would not benefit from antibiotics.  #Atrial fibrillation Sinus rhythm on cardiac monitoring.  Continue Eliquis 2.5 mg twice daily  #Hyperlipidemia Continue atorvastatin 20 mg  #Hypertension 981-191 systolic overnight. Norvasc 5 mg  #History of CVA Continue aspirin 81 mg daily and atorvastatin as above  Dispo: Anticipated discharge today pending OT/PT Eval.   Tamsen Snider, MD PGY1  [phone]

## 2018-10-05 NOTE — Care Management (Signed)
Spoke w patient's daughter. Discussed PT OT recs, discussed DME needs. No needs identified. Notified RN patient has no CM needs for DC

## 2018-10-05 NOTE — ED Triage Notes (Signed)
Pt arrives with daughter who brought her back to hospital after being discharged at [facility] the discharge papers state if her temp got above 100.4 to come back. Pt daughter states she took her temp and it was 100.8.

## 2018-10-05 NOTE — Discharge Summary (Signed)
Name: Sierra Burch MRN: [id number] DOB: 28-Sep-1930 85 y.o. PCP: System, Pcp Not In  Date of Admission: 10/04/2018  7:11 AM Date of Discharge:  Attending Physician: Axel Filler, *  Discharge Diagnosis: 1.Syncope 2. Asymptomatic bacteriuria   Discharge Medications: Allergies as of 10/05/2018   No Known Allergies     Medication List    STOP taking these medications   cetirizine 10 MG tablet Commonly known as: ZYRTEC     TAKE these medications   alendronate 70 MG tablet Commonly known as: FOSAMAX Take 70 mg by mouth once a week. Tuesday   amLODipine 5 MG tablet Commonly known as: NORVASC Take 5 mg by mouth daily.   aspirin 81 MG chewable tablet Chew 81 mg by mouth daily.   atorvastatin 20 MG tablet Commonly known as: LIPITOR Take 20 mg by mouth daily.   Eliquis 2.5 MG Tabs tablet Generic drug: apixaban Take 2.5 mg by mouth 2 (two) times daily.   lisinopril 5 MG tablet Commonly known as: ZESTRIL Take 5 mg by mouth daily.   Vitamin D 50 MCG (2000 UT) Caps Take 5,000 Units by mouth daily.       Disposition and follow-up:   [name] was discharged from [facility] in Stable condition.  At the hospital follow up visit please address:  1.    Syncope - Held Cetirizine , restart if needed. Encourage PO intake at home.  Asymptomatic bacteriuria UA showed moderate leukocytes, negative nitrites. Prelim results of 100,000 gram negative rods. Patient asymptomatic.  - Follow up symptoms, and urine culture results.  2.  Labs / imaging needed at time of follow-up: BMP  3.  Pending labs/ test needing follow-up: urine cultures  Follow-up Appointments:   Hospital Course by problem list:   #Syncope Patient had a syncopal episode this morning after a fall in her bathroom which caused her to hit the back of her head.  She lost consciousness for a few minutes.  CT head without any acute intracranial abnormalities.  CT cervical spine  showing C5-6, C6-7 with degenerative changes. Patient monitored on telemetry and in sinus rhythm overnight.  Orthostatic  lying to sitting, drop in systolic pressure 725>366.  Patient with good p.o. intake this morning and spoke to daughter about plan to remind her mother to drink more fluids throughout the day.   #Asymptomatic bacteriuria UA from yesterday showed negative nitrites moderate leukocytes rare bacteria.  Urine culture with preliminary results of 100,000 gram-negative rods.  Patient is asymptomatic.  Denies dysuria abdominal pain fevers chills, and WBC is normal.  Without other signs/symptoms likely a chronic colonization and patient would not benefit from antibiotics.  Discharge Vitals:   BP 139/69   Pulse 72   Temp 97.7 F (36.5 C) (Oral)   Resp 16   Ht 5\' 2"  (1.575 m)   Wt 59 kg   SpO2 97%   BMI 23.78 kg/m   Pertinent Labs, Studies, and Procedures:  CBC Latest Ref Rng & Units 10/07/2018 10/06/2018 10/05/2018  WBC 4.0 - 10.5 K/uL 8.5 11.5(H) 15.3(H)  Hemoglobin 12.0 - 15.0 g/dL 11.0(L) 11.7(L) 13.0  Hematocrit 36.0 - 46.0 % 33.4(L) 35.5(L) 39.9  Platelets 150 - 400 K/uL 180 181 212   BMP Latest Ref Rng & Units 10/05/2018 10/05/2018 10/04/2018  Glucose 70 - 99 mg/dL 114(H) 110(H) 77  BUN 8 - 23 mg/dL 21 13 20   Creatinine 0.44 - 1.00 mg/dL 0.86 0.79 0.84  Sodium 135 - 145 mmol/L 138  137 141  Potassium 3.5 - 5.1 mmol/L 4.2 3.5 4.1  Chloride 98 - 111 mmol/L 102 102 107  CO2 22 - 32 mmol/L 27 26 20(L)  Calcium 8.9 - 10.3 mg/dL 9.6 9.2 9.1    CT Head Without Contrast and Cervical Spine  Brain: No evidence of acute infarction, hemorrhage, hydrocephalus, extra-axial collection or mass lesion/mass effect. Atrophy, chronic microvascular ischemic change remote right caudate and left corona radiata lacunar infarcts noted.  Vascular: Extensive atherosclerosis.  Skull: No fracture or focal lesion.  Sinuses/Orbits: Status post cataract surgery.  Otherwise negative.   Other: None.  CT CERVICAL SPINE FINDINGS  Alignment: Maintained.  Skull base and vertebrae: No acute fracture. No primary bone lesion or focal pathologic process. Status post left laminotomy at C6. 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  Soft tissues and spinal canal: No prevertebral fluid or swelling. No visible canal hematoma.  Disc levels: Loss of disc space height and endplate spurring are worst at C5-6 and C6-7.  Upper chest: Clear.  Other: None.  IMPRESSION: No acute abnormality head or cervical spine.  Atrophy and chronic microvascular ischemic change.  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     Discharge Instructions: Discharge Instructions    Call MD for:  persistant dizziness or light-headedness   Complete by: As directed    Call MD for:  temperature >100.4   Complete by: As directed    Diet - low sodium heart healthy   Complete by: As directed    Discharge instructions   Complete by: As directed    Ms. Sierra Burch,  [name]  It is likely that you became lightheaded from dehydration.  While you were here we gave you IV fluids.  We also stopped one of your medications, Zyrtec, that has side effect of dizziness.  We also found some bacteria in your urine which can be common among elderly individuals.  We do not treat this with antibiotics unless you have symptoms.  Please increase your oral intake of water at home and follow-up with your primary care physician after discharge.  My best,  Dr.Javeon Macmurray   Increase activity slowly   Complete by: As directed       Signed:  Thurmon FairJeff Kaitlynn Tramontana, MD PGY1  [phone]

## 2018-10-05 NOTE — Evaluation (Signed)
Occupational Therapy Evaluation Patient Details Name: Sierra Burch MRN: [id number] DOB: [birth year] Today's Date: 10/05/2018    History of Present Illness Patient is a 85 y/o female presenting to the ED on 10/04/2018 s/p fall/syncope. Past medical history of dementia, atrial fibrillation, hypertension, hyperlipidemia, CVA. CT head without any acute intracranial abnormalities.    Clinical Impression   Pt admitted with fall/syncope. Pt currently with functional limitations due to the deficits listed below (see OT Problem List). Pt poor historian of PLOF. However, did report at an adult center during the day and with daughter at home. Pt requires max verbal cues for safety as impulsive when completing activity. Pt BP readings bellow with activity and no reports of dizziness.  Pt will benefit from skilled OT to increase their safety and independence with ADL and functional mobility for ADL to facilitate discharge to venue listed below.   Supine: 136/75 Sitting: 115/77 Post ambulation: 145/77      Follow Up Recommendations  Supervision/Assistance - 24 hour    Equipment Recommendations  Other (comment)(none but poor historian )    Recommendations for Other Services       Precautions / Restrictions Precautions Precautions: Fall Restrictions Weight Bearing Restrictions: No      Mobility Bed Mobility Overal bed mobility: Modified Independent                Transfers Overall transfer level: Needs assistance Equipment used: Rolling walker (2 wheeled) Transfers: Sit to/from Stand Sit to Stand: Supervision         General transfer comment: cues for hand placement    Balance Overall balance assessment: Needs assistance Sitting-balance support: Bilateral upper extremity supported Sitting balance-Leahy Scale: Good     Standing balance support: Bilateral upper extremity supported Standing balance-Leahy Scale: Fair                             ADL either  performed or assessed with clinical judgement   ADL Overall ADL's : Needs assistance/impaired Eating/Feeding: Supervision/ safety   Grooming: Wash/dry hands;Wash/dry face;Min guard Grooming Details (indicate cue type and reason): cues for location of materials and pacing Upper Body Bathing: Min guard;Sitting;Standing   Lower Body Bathing: Min guard;Sit to/from stand   Upper Body Dressing : Min guard;Set up;Sitting;Standing;Cueing for safety   Lower Body Dressing: Min guard;Sit to/from stand;Cueing for safety   Toilet Transfer: Min guard;Cueing for sequencing;Cueing for safety   Toileting- Clothing Manipulation and Hygiene: Min guard;Cueing for safety;Cueing for sequencing;Sit to/from stand   Tub/ Banker: Min guard;Cueing for safety;Cueing for sequencing   Functional mobility during ADLs: Min guard;Rolling walker;Cueing for safety;Cueing for sequencing       Vision Baseline Vision/History: Wears glasses Wears Glasses: (pt not sure) Patient Visual Report: No change from baseline Vision Assessment?: No apparent visual deficits     Perception Perception Perception Tested?: No   Praxis Praxis Praxis tested?: Not tested    Pertinent Vitals/Pain Pain Assessment: 0-10 Pain Score: 0-No pain     Hand Dominance Right   Extremity/Trunk Assessment Upper Extremity Assessment Upper Extremity Assessment: RUE deficits/detail RUE Deficits / Details: about 75% WFL RUE Sensation: WNL RUE Coordination: WNL   Lower Extremity Assessment Lower Extremity Assessment: Defer to PT evaluation       Communication Communication Communication: No difficulties   Cognition Arousal/Alertness: Awake/alert Behavior During Therapy: Impulsive Overall Cognitive Status: No family/caregiver present to determine baseline cognitive functioning  General Comments: cues to pace self with out of bed activities   General Comments        Exercises     Shoulder Instructions      Home Living Family/patient expects to be discharged to:: Private residence Living Arrangements: Children Available Help at Discharge: Family;Available 24 hours/day;Available PRN/intermittently Type of Home: House Home Access: Stairs to enter Entergy CorporationEntrance Stairs-Number of Steps: 2 Entrance Stairs-Rails: Right;Left Home Layout: Two level Alternate Level Stairs-Number of Steps: (flight) Alternate Level Stairs-Rails: Right Bathroom Shower/Tub: Chief Strategy OfficerTub/shower unit   Bathroom Toilet: Standard Bathroom Accessibility: Yes How Accessible: Accessible via walker Home Equipment: Walker - 2 wheels;Shower seat;Toilet riser   Additional Comments: staes she goes to an adult day program 5x/week      Prior Functioning/Environment Level of Independence: Needs assistance  Gait / Transfers Assistance Needed: reports intermittent use of RW ADL's / Homemaking Assistance Needed: states her daughter will sometime assit with ADLs   Comments: poor STM and decrease oreination         OT Problem List:        OT Treatment/Interventions:      OT Goals(Current goals can be found in the care plan section) Acute Rehab OT Goals Patient Stated Goal: to go OT Goal Formulation: With patient Time For Goal Achievement: 10/12/18 Potential to Achieve Goals: Good  OT Frequency:     Barriers to D/C:            Co-evaluation PT/OT/SLP Co-Evaluation/Treatment: Yes Reason for Co-Treatment: Complexity of the patient's impairments (multi-system involvement)   OT goals addressed during session: ADL's and self-care      AM-PAC OT "6 Clicks" Daily Activity     Outcome Measure Help from another person eating meals?: A Little Help from another person taking care of personal grooming?: A Little Help from another person toileting, which includes using toliet, bedpan, or urinal?: A Little Help from another person bathing (including washing, rinsing, drying)?: A Little Help  from another person to put on and taking off regular upper body clothing?: A Little Help from another person to put on and taking off regular lower body clothing?: A Little 6 Click Score: 18   End of Session Equipment Utilized During Treatment: Gait belt  Activity Tolerance: Patient tolerated treatment well Patient left: in chair;with call bell/phone within reach;with chair alarm set                   Time: [id number] OT Time Calculation (min): 19 min Charges:  OT General Charges $OT Visit: 1 Visit OT Evaluation $OT Eval Low Complexity: 1 Low  Alphia MohKira Kayliah Tindol OTR/L  Acute Rehab Services  [phone] office number [phone] pager number   Alphia MohKira  Elenie Coven 10/05/2018, 10:36 AM

## 2018-10-05 NOTE — Evaluation (Signed)
Physical Therapy Evaluation Patient Details Name: Sierra Burch MRN: [id number] DOB: [birth year] Today's Date: 10/05/2018   History of Present Illness  Patient is a 85 y/o female presenting to the ED on 10/04/2018 s/p fall/syncope. Past medical history of dementia, atrial fibrillation, hypertension, hyperlipidemia, CVA. CT head without any acute intracranial abnormalities.     Clinical Impression  Patient admitted with the above. Patient reports Mod I with mobility and ADLs prior to admission - she reports she lives with her daughter and attends an adult day program. Patient today performing functional mobility at general min guard level for safety with verbal cueing required to improve safety awareness needed for mobility and obstacle navigation. PT to follow acutely to maximize safe independent functional mobility prior to return home.     Follow Up Recommendations No PT follow up;Supervision/Assistance - 24 hour    Equipment Recommendations  None recommended by PT    Recommendations for Other Services       Precautions / Restrictions Precautions Precautions: Fall Restrictions Weight Bearing Restrictions: No      Mobility  Bed Mobility Overal bed mobility: Modified Independent                Transfers Overall transfer level: Needs assistance Equipment used: Rolling walker (2 wheeled) Transfers: Sit to/from Stand Sit to Stand: Supervision         General transfer comment: cues for hand placement  Ambulation/Gait Ambulation/Gait assistance: Min guard Gait Distance (Feet): 140 Feet Assistive device: Rolling walker (2 wheeled) Gait Pattern/deviations: Step-through pattern;Decreased stride length;Drifts right/left Gait velocity: decreased   General Gait Details: cueing for safety wtih mobility for obstacle navigation; mild instability  Stairs            Wheelchair Mobility    Modified Rankin (Stroke Patients Only)       Balance Overall balance  assessment: Needs assistance Sitting-balance support: Bilateral upper extremity supported Sitting balance-Leahy Scale: Good     Standing balance support: Bilateral upper extremity supported Standing balance-Leahy Scale: Fair                               Pertinent Vitals/Pain Pain Assessment: No/denies pain Pain Score: 0-No pain    Home Living Family/patient expects to be discharged to:: Private residence Living Arrangements: Children Available Help at Discharge: Family;Available 24 hours/day;Available PRN/intermittently Type of Home: House Home Access: Stairs to enter Entrance Stairs-Rails: Psychiatric nurse of Steps: 2 Home Layout: Two level Home Equipment: Walker - 2 wheels;Shower seat;Toilet riser Additional Comments: staes she goes to an adult day program 5x/week    Prior Function Level of Independence: Needs assistance   Gait / Transfers Assistance Needed: reports intermittent use of RW  ADL's / Homemaking Assistance Needed: states her daughter will sometime assit with ADLs  Comments: poor STM and decrease oreination      Hand Dominance   Dominant Hand: Right    Extremity/Trunk Assessment   Upper Extremity Assessment Upper Extremity Assessment: RUE deficits/detail RUE Deficits / Details: about 75% WFL RUE Sensation: WNL RUE Coordination: WNL    Lower Extremity Assessment Lower Extremity Assessment: Overall WFL for tasks assessed    Cervical / Trunk Assessment Cervical / Trunk Assessment: Normal  Communication   Communication: No difficulties  Cognition Arousal/Alertness: Awake/alert Behavior During Therapy: Impulsive Overall Cognitive Status: No family/caregiver present to determine baseline cognitive functioning  General Comments: cues to pace self with out of bed activities      General Comments General comments (skin integrity, edema, etc.): states she attends an adult  day program    Exercises     Assessment/Plan    PT Assessment Patient needs continued PT services  PT Problem List Decreased strength;Decreased activity tolerance;Decreased balance;Decreased mobility;Decreased knowledge of use of DME;Decreased safety awareness       PT Treatment Interventions DME instruction;Gait training;Stair training;Functional mobility training;Therapeutic activities;Therapeutic exercise;Balance training;Neuromuscular re-education;Patient/family education    PT Goals (Current goals can be found in the Care Plan section)  Acute Rehab PT Goals Patient Stated Goal: to go home PT Goal Formulation: With patient Time For Goal Achievement: 10/19/18 Potential to Achieve Goals: Good    Frequency Min 3X/week   Barriers to discharge        Co-evaluation PT/OT/SLP Co-Evaluation/Treatment: Yes Reason for Co-Treatment: Necessary to address cognition/behavior during functional activity;For patient/therapist safety;To address functional/ADL transfers PT goals addressed during session: Mobility/safety with mobility;Balance;Proper use of DME OT goals addressed during session: ADL's and self-care       AM-PAC PT "6 Clicks" Mobility  Outcome Measure Help needed turning from your back to your side while in a flat bed without using bedrails?: None Help needed moving from lying on your back to sitting on the side of a flat bed without using bedrails?: None Help needed moving to and from a bed to a chair (including a wheelchair)?: A Little Help needed standing up from a chair using your arms (e.g., wheelchair or bedside chair)?: A Little Help needed to walk in hospital room?: A Little Help needed climbing 3-5 steps with a railing? : A Little 6 Click Score: 20    End of Session Equipment Utilized During Treatment: Gait belt Activity Tolerance: Patient tolerated treatment well Patient left: in chair;with call bell/phone within reach;with chair alarm set Nurse  Communication: Mobility status PT Visit Diagnosis: Unsteadiness on feet (R26.81);Other abnormalities of gait and mobility (R26.89);Muscle weakness (generalized) (M62.81)    Time: [id number] PT Time Calculation (min) (ACUTE ONLY): 19 min   Charges:   PT Evaluation $PT Eval Low Complexity: 1 Low          Kipp LaurenceStephanie R Female Iafrate, PT, DPT Supplemental Physical Therapist 10/05/18 12:09 PM Pager: [phone] Office: [phone]

## 2018-10-06 DIAGNOSIS — F0390 Unspecified dementia without behavioral disturbance: Secondary | ICD-10-CM | POA: Diagnosis present

## 2018-10-06 DIAGNOSIS — N390 Urinary tract infection, site not specified: Secondary | ICD-10-CM | POA: Diagnosis not present

## 2018-10-06 DIAGNOSIS — N10 Acute tubulo-interstitial nephritis: Secondary | ICD-10-CM | POA: Diagnosis present

## 2018-10-06 DIAGNOSIS — R55 Syncope and collapse: Secondary | ICD-10-CM | POA: Diagnosis not present

## 2018-10-06 HISTORY — DX: Acute pyelonephritis: N10

## 2018-10-06 LAB — URINALYSIS, ROUTINE W REFLEX MICROSCOPIC
Bacteria, UA: NONE SEEN
Bilirubin Urine: NEGATIVE
Glucose, UA: NEGATIVE mg/dL
Hgb urine dipstick: NEGATIVE
Ketones, ur: 5 mg/dL — AB
Nitrite: NEGATIVE
Protein, ur: NEGATIVE mg/dL
Specific Gravity, Urine: 1.016 (ref 1.005–1.030)
pH: 6 (ref 5.0–8.0)

## 2018-10-06 LAB — CBC
HCT: 35.5 % — ABNORMAL LOW (ref 36.0–46.0)
Hemoglobin: 11.7 g/dL — ABNORMAL LOW (ref 12.0–15.0)
MCH: 31.4 pg (ref 26.0–34.0)
MCHC: 33 g/dL (ref 30.0–36.0)
MCV: 95.2 fL (ref 80.0–100.0)
Platelets: 181 10*3/uL (ref 150–400)
RBC: 3.73 MIL/uL — ABNORMAL LOW (ref 3.87–5.11)
RDW: 14.1 % (ref 11.5–15.5)
WBC: 11.5 10*3/uL — ABNORMAL HIGH (ref 4.0–10.5)
nRBC: 0 % (ref 0.0–0.2)

## 2018-10-06 LAB — LACTIC ACID, PLASMA: Lactic Acid, Venous: 0.9 mmol/L (ref 0.5–1.9)

## 2018-10-06 LAB — URINE CULTURE: Culture: >=100000 — AB

## 2018-10-06 MED ORDER — ACETAMINOPHEN 325 MG PO TABS: 650.0000 mg | ORAL_TABLET | Freq: Four times a day (QID) | ORAL | Status: DC | PRN

## 2018-10-06 MED ORDER — ASPIRIN 81 MG PO CHEW
81.0000 mg | CHEWABLE_TABLET | Freq: Every day | ORAL | Status: DC
  Administered 2018-10-06 – 2018-10-07 (×2): 81 mg via ORAL
  Filled 2018-10-06 (×2): qty 1

## 2018-10-06 MED ORDER — ATORVASTATIN CALCIUM 10 MG PO TABS
20.0000 mg | ORAL_TABLET | Freq: Every day | ORAL | Status: DC
  Administered 2018-10-06 – 2018-10-07 (×2): 20 mg via ORAL
  Filled 2018-10-06 (×2): qty 2

## 2018-10-06 MED ORDER — CEPHALEXIN 250 MG PO CAPS
500.0000 mg | ORAL_CAPSULE | Freq: Two times a day (BID) | ORAL | Status: DC
  Administered 2018-10-06 – 2018-10-07 (×2): 500 mg via ORAL
  Filled 2018-10-06 (×2): qty 2

## 2018-10-06 MED ORDER — APIXABAN 2.5 MG PO TABS
2.5000 mg | ORAL_TABLET | Freq: Two times a day (BID) | ORAL | Status: DC
  Administered 2018-10-06 – 2018-10-07 (×3): 2.5 mg via ORAL
  Filled 2018-10-06 (×3): qty 1

## 2018-10-06 MED ORDER — SENNOSIDES-DOCUSATE SODIUM 8.6-50 MG PO TABS: 1.0000 | ORAL_TABLET | Freq: Every evening | ORAL | Status: DC | PRN

## 2018-10-06 MED ORDER — ACETAMINOPHEN 650 MG RE SUPP: 650.0000 mg | Freq: Four times a day (QID) | RECTAL | Status: DC | PRN

## 2018-10-06 NOTE — ED Notes (Signed)
ED TO INPATIENT HANDOFF REPORT  ED Nurse Name and Phone #: [phone] Donetta PottsSarah  S Name/Age/Gender Colin InaLarah Pardee 85 y.o. female Room/Bed: 030C/030C  Code Status   Code Status: DNR  Home/SNF/Other Home Patient oriented to: self Is this baseline? Yes   Triage Complete: Triage complete  Chief Complaint FEVER  Triage Note Pt arrives with daughter who brought her back to hospital after being discharged at 5pm. States the discharge papers state if her temp got above 100.4 to come back. Pt daughter states she took her temp and it was 100.8.    Allergies No Known Allergies  Level of Care/Admitting Diagnosis ED Disposition    ED Disposition Condition Comment   Admit  [facility] [[id number]]  Level of Care: Telemetry Medical [104]  Covid Evaluation: Confirmed COVID Negative  Diagnosis: Bacteriuria [301804]  Admitting Physician: Tyson AliasVINCENT, DUNCAN THOMAS [[id number]]  Attending Physician: Tyson AliasVINCENT, DUNCAN THOMAS [phone][[id number]]  PT Class (Do Not Modify): Observation [104]  PT Acc Code (Do Not Modify): Observation [10022]       B Medical/Surgery History Past Medical History:  Diagnosis Date  . A-fib (HCC)   . Dementia (HCC)   . Hyperlipidemia   . Hypertension   . Stroke [facility](HCC)    History reviewed. No pertinent surgical history.   A IV Location/Drains/Wounds Patient Lines/Drains/Airways Status   Active Line/Drains/Airways    Name:   Placement date:   Placement time:   Site:   Days:   Peripheral IV 10/05/18 Left Antecubital   10/05/18    2200    Antecubital   1   External Urinary Catheter   10/04/18    0916    -   2          Intake/Output Last 24 hours No intake or output data in the 24 hours ending 10/06/18 0507  Labs/Imaging Results for orders placed or performed during the hospital encounter of 10/05/18 (from the past 48 hour(s))  Lactic acid, plasma     Status: None   Collection Time: 10/05/18 10:01 PM  Result Value Ref Range   Lactic Acid,  Venous 1.1 0.5 - 1.9 mmol/L    Comment: Performed at [facility], [address] [place], [place] [address]  Comprehensive metabolic panel     Status: Abnormal   Collection Time: 10/05/18 10:01 PM  Result Value Ref Range   Sodium 138 135 - 145 mmol/L   Potassium 4.2 3.5 - 5.1 mmol/L   Chloride 102 98 - 111 mmol/L   CO2 27 22 - 32 mmol/L   Glucose, Bld 114 (H) 70 - 99 mg/dL   BUN 21 8 - 23 mg/dL   Creatinine, Ser 3.080.86 0.44 - 1.00 mg/dL   Calcium 9.6 8.9 - 65.710.3 mg/dL   Total Protein 8.0 6.5 - 8.1 g/dL   Albumin 3.7 3.5 - 5.0 g/dL   AST 17 15 - 41 U/L   ALT 16 0 - 44 U/L   Alkaline Phosphatase 70 38 - 126 U/L   Total Bilirubin 0.9 0.3 - 1.2 mg/dL   GFR calc non Af Amer >60 >60 mL/min   GFR calc Af Amer >60 >60 mL/min   Anion gap 9 5 - 15    Comment: Performed at [facility], [address] [place], [place] [address]  CBC WITH DIFFERENTIAL     Status: Abnormal   Collection Time: 10/05/18 10:01 PM  Result Value Ref Range   WBC 15.3 (H) 4.0 - 10.5  K/uL   RBC 4.19 3.87 - 5.11 MIL/uL   Hemoglobin 13.0 12.0 - 15.0 g/dL   HCT 39.9 36.0 - 46.0 %   MCV 95.2 80.0 - 100.0 fL   MCH 31.0 26.0 - 34.0 pg   MCHC 32.6 30.0 - 36.0 g/dL   RDW 14.0 11.5 - 15.5 %   Platelets 212 150 - 400 K/uL   nRBC 0.0 0.0 - 0.2 %   Neutrophils Relative % 91 %   Neutro Abs 13.8 (H) 1.7 - 7.7 K/uL   Lymphocytes Relative 4 %   Lymphs Abs 0.7 0.7 - 4.0 K/uL   Monocytes Relative 5 %   Monocytes Absolute 0.8 0.1 - 1.0 K/uL   Eosinophils Relative 0 %   Eosinophils Absolute 0.0 0.0 - 0.5 K/uL   Basophils Relative 0 %   Basophils Absolute 0.0 0.0 - 0.1 K/uL   Immature Granulocytes 0 %   Abs Immature Granulocytes 0.05 0.00 - 0.07 K/uL    Comment: Performed at [facility] [address], [place], [place] [address]  APTT     Status: None   Collection Time: 10/05/18 10:01 PM  Result Value Ref Range   aPTT 33 24 - 36 seconds    Comment: Performed at [facility] [address]  [place], [place] [address]  Protime-INR     Status: None   Collection Time: 10/05/18 10:01 PM  Result Value Ref Range   Prothrombin Time 14.9 11.4 - 15.2 seconds   INR 1.2 0.8 - 1.2    Comment: (NOTE) INR goal varies based on device and disease states. Performed at [facility], [place] [address], [place], [place] [address]   SARS Coronavirus 2 (CEPHEID - Performed in [place] hospital lab), Hosp Order     Status: None   Collection Time: 10/05/18 10:09 PM   Specimen: Nasopharyngeal Swab  Result Value Ref Range   SARS Coronavirus 2 NEGATIVE NEGATIVE    Comment: (NOTE) If result is NEGATIVE SARS-CoV-2 target nucleic acids are NOT DETECTED. The SARS-CoV-2 RNA is generally detectable in upper and lower  respiratory specimens during the acute phase of infection. The lowest  concentration of SARS-CoV-2 viral copies this assay can detect is 250  copies / mL. A negative result does not preclude SARS-CoV-2 infection  and should not be used as the sole basis for treatment or other  patient management decisions.  A negative result may occur with  improper specimen collection / handling, submission of specimen other  than nasopharyngeal swab, presence of viral mutation(s) within the  areas targeted by this assay, and inadequate number of viral copies  (<250 copies / mL). A negative result must be combined with clinical  observations, patient history, and epidemiological information. If result is POSITIVE SARS-CoV-2 target nucleic acids are DETECTED. The SARS-CoV-2 RNA is generally detectable in upper and lower  respiratory specimens dur ing the acute phase of infection.  Positive  results are indicative of active infection with SARS-CoV-2.  Clinical  correlation with patient history and other diagnostic information is  necessary to determine patient infection status.  Positive results do  not rule out bacterial infection or co-infection with other viruses. If result is PRESUMPTIVE  POSTIVE SARS-CoV-2 nucleic acids MAY BE PRESENT.   A presumptive positive result was obtained on the submitted specimen  and confirmed on repeat testing.  While 2019 novel coronavirus  (SARS-CoV-2) nucleic acids may be present in the submitted sample  additional confirmatory testing  may be necessary for epidemiological  and / or clinical management purposes  to differentiate between  SARS-CoV-2 and other Sarbecovirus currently known to infect humans.  If clinically indicated additional testing with an alternate test  methodology [phone]([id number]) is advised. The SARS-CoV-2 RNA is generally  detectable in upper and lower respiratory sp ecimens during the acute  phase of infection. The expected result is Negative. Fact Sheet for Patients:  BoilerBrush.com.cyhttps://www.fda.gov/media/136312/download Fact Sheet for Healthcare Providers: https://pope.com/https://www.fda.gov/media/136313/download This test is not yet approved or cleared by the [place] FDA and has been authorized for detection and/or diagnosis of SARS-CoV-2 by FDA under an Emergency Use Authorization (EUA).  This EUA will remain in effect (meaning this test can be used) for the duration of the COVID-19 declaration under Section 564(b)(1) of the Act, 21 U.S.C. section 360bbb-3(b)(1), unless the authorization is terminated or revoked sooner. Performed at [facility], [address] [place], [place] [address]   Lactic acid, plasma     Status: None   Collection Time: 10/05/18 11:59 PM  Result Value Ref Range   Lactic Acid, Venous 0.9 0.5 - 1.9 mmol/L    Comment: Performed at [facility], [address] [place], [place] [address]  Urinalysis, Routine w reflex microscopic     Status: Abnormal   Collection Time: 10/06/18 12:55 AM  Result Value Ref Range   Color, Urine YELLOW YELLOW   APPearance CLEAR CLEAR   Specific Gravity, Urine 1.016 1.005 - 1.030   pH 6.0 5.0 - 8.0   Glucose, UA NEGATIVE NEGATIVE mg/dL   Hgb urine dipstick NEGATIVE  NEGATIVE   Bilirubin Urine NEGATIVE NEGATIVE   Ketones, ur 5 (A) NEGATIVE mg/dL   Protein, ur NEGATIVE NEGATIVE mg/dL   Nitrite NEGATIVE NEGATIVE   Leukocytes,Ua TRACE (A) NEGATIVE   RBC / HPF 0-5 0 - 5 RBC/hpf   WBC, UA 6-10 0 - 5 WBC/hpf   Bacteria, UA NONE SEEN NONE SEEN   Squamous Epithelial / LPF 0-5 0 - 5    Comment: Performed at [facility], [address] [place], [place] [address]   Ct Head Wo Contrast  Result Date: 10/04/2018 CLINICAL DATA:  Status post fall. Loss of consciousness. Initial encounter. EXAM: CT HEAD WITHOUT CONTRAST CT CERVICAL SPINE WITHOUT CONTRAST TECHNIQUE: Multidetector CT imaging of the head and cervical spine was performed following the standard protocol without intravenous contrast. Multiplanar CT image reconstructions of the cervical spine were also generated. COMPARISON:  Head CT 01/05/2018. FINDINGS: CT HEAD FINDINGS Brain: No evidence of acute infarction, hemorrhage, hydrocephalus, extra-axial collection or mass lesion/mass effect. Atrophy, chronic microvascular ischemic change remote right caudate and left corona radiata lacunar infarcts noted. Vascular: Extensive atherosclerosis. Skull: No fracture or focal lesion. Sinuses/Orbits: Status post cataract surgery.  Otherwise negative. Other: None. CT CERVICAL SPINE FINDINGS Alignment: Maintained. Skull base and vertebrae: No acute fracture. No primary bone lesion or focal pathologic process. Status post left laminotomy at C6. 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 Soft tissues and spinal canal: No prevertebral fluid or swelling. No visible canal hematoma. Disc levels: Loss of disc space height and endplate spurring are worst at C5-6 and C6-7. Upper chest: Clear. Other: None. IMPRESSION: No acute abnormality head or cervical spine. Atrophy and chronic microvascular ischemic change. 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 Electronically Signed   By: Drusilla Kannerhomas  Dalessio M.D.   On:  10/04/2018 10:28   Ct Cervical Spine Wo Contrast  Result Date: 10/04/2018 CLINICAL  DATA:  Status post fall. Loss of consciousness. Initial encounter. EXAM: CT HEAD WITHOUT CONTRAST CT CERVICAL SPINE WITHOUT CONTRAST TECHNIQUE: Multidetector CT imaging of the head and cervical spine was performed following the standard protocol without intravenous contrast. Multiplanar CT image reconstructions of the cervical spine were also generated. COMPARISON:  Head CT 01/05/2018. FINDINGS: CT HEAD FINDINGS Brain: No evidence of acute infarction, hemorrhage, hydrocephalus, extra-axial collection or mass lesion/mass effect. Atrophy, chronic microvascular ischemic change remote right caudate and left corona radiata lacunar infarcts noted. Vascular: Extensive atherosclerosis. Skull: No fracture or focal lesion. Sinuses/Orbits: Status post cataract surgery.  Otherwise negative. Other: None. CT CERVICAL SPINE FINDINGS Alignment: Maintained. Skull base and vertebrae: No acute fracture. No primary bone lesion or focal pathologic process. Status post left laminotomy at C6. 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 Soft tissues and spinal canal: No prevertebral fluid or swelling. No visible canal hematoma. Disc levels: Loss of disc space height and endplate spurring are worst at C5-6 and C6-7. Upper chest: Clear. Other: None. IMPRESSION: No acute abnormality head or cervical spine. Atrophy and chronic microvascular ischemic change. 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 Electronically Signed   By: Drusilla Kanner M.D.   On: 10/04/2018 10:28   Dg Chest Port 1 View  Result Date: 10/05/2018 CLINICAL DATA:  85 year old female with fever. EXAM: PORTABLE CHEST 1 VIEW COMPARISON:  Chest radiograph dated 01/05/2018 FINDINGS: The lungs are clear. There is no pleural effusion or pneumothorax. Stable cardiomegaly. Atherosclerotic calcification of the aortic arch. Degenerative changes of the spine and  shoulders. No acute osseous pathology. IMPRESSION: No active disease. Electronically Signed   By: Elgie Collard M.D.   On: 10/05/2018 22:13    Pending Labs Unresulted Labs (From admission, onward)    Start     Ordered   10/06/18 0500  CBC  Tomorrow morning,   R     10/06/18 0039   10/06/18 0500  Basic metabolic panel  Tomorrow morning,   R     10/06/18 0039   10/05/18 2146  Blood Culture (routine x 2)  BLOOD CULTURE X 2,   STAT     10/05/18 2145   10/05/18 2146  Urine culture  ONCE - STAT,   STAT     10/05/18 2145          Vitals/Pain Today's Vitals   10/06/18 0330 10/06/18 0400 10/06/18 0404 10/06/18 0415  BP: 130/72 (!) 144/80  (!) 128/92  Pulse: 70 77  79  Resp: 18 (!) 22  19  Temp:   98.8 F (37.1 C)   TempSrc:   Axillary   SpO2: 95% 100%  96%  PainSc:        Isolation Precautions No active isolations  Medications Medications  cefTRIAXone (ROCEPHIN) 1 g in sodium chloride 0.9 % 100 mL IVPB (0 g Intravenous Stopped 10/05/18 2337)  aspirin chewable tablet 81 mg (has no administration in time range)  atorvastatin (LIPITOR) tablet 20 mg (has no administration in time range)  apixaban (ELIQUIS) tablet 2.5 mg (has no administration in time range)  acetaminophen (TYLENOL) tablet 650 mg (has no administration in time range)    Or  acetaminophen (TYLENOL) suppository 650 mg (has no administration in time range)  senna-docusate (Senokot-S) tablet 1 tablet (has no administration in time range)  sodium chloride 0.9 % bolus 1,000 mL (0 mLs Intravenous Stopped 10/05/18 2352)  acetaminophen (TYLENOL) tablet 650 mg (650 mg Oral Given 10/05/18 2347)    Mobility walks  with person assist High fall risk   Focused Assessments Neuro Assessment Handoff:  Swallow screen pass? Yes          Neuro Assessment:   Neuro Checks:      Last Documented NIHSS Modified Score:   Has TPA been given? No If patient is a Neuro Trauma and patient is going to OR before floor call report to  4N Charge nurse: [phone] or [phone]     R Recommendations: See Admitting Provider Note  Report given to:   Additional Notes: na

## 2018-10-06 NOTE — ED Notes (Signed)
Attempted to get pt's oral temp but pt became agitated and refused. Axillary temp obtained

## 2018-10-06 NOTE — ED Notes (Signed)
Tele   Breakfast ordered  

## 2018-10-06 NOTE — Progress Notes (Signed)
   Subjective: Patient's daughter is present on exam.  Patient with a history of dementia.  She is pleasant and does not report symptoms.  Her daughter was worried at home after reported to have fever to 100.4 and endorsed sense of urgency.  She took her mother to the restroom 3 times and the patient was unable to urinate twice. Her symptoms with the fever prompted daughter to bring her back to hospital. Endorses some previous back located in center of lower back and abdominal pains. Denies dysuria, flank pain, suprapubic pain, and current back pain.  Objective:  Vital signs in last 24 hours: Vitals:   10/06/18 0836 10/06/18 0856 10/06/18 0913 10/06/18 1202  BP:   131/69 127/69  Pulse:   77 74  Resp:   18 18  Temp: 98.5 F (36.9 C)  98.3 F (36.8 C) 98 F (36.7 C)  TempSrc: Oral  Oral Oral  SpO2:    94%  Weight:  55 kg    Height:  5\' 2"  (1.575 m)     Physical Exam Constitutional:      General: She is not in acute distress.    Appearance: Normal appearance.  Cardiovascular:     Rate and Rhythm: Normal rate and regular rhythm.  Pulmonary:     Effort: Pulmonary effort is normal.     Breath sounds: Normal breath sounds.  Abdominal:     General: Bowel sounds are normal.     Palpations: Abdomen is soft.  Genitourinary:    Comments: No costovertebral tenderness. No pain on suprapubic palpation.    Assessment/Plan:  Principal Problem:   UTI (urinary tract infection) Active Problems:   Syncope   Fall   Dementia [facility])  85 year old female with chronic dementia admitted with fever, urgency, and decrease functional status due to likely urinary tract infection.  #Urinary tract infection Patient afebrile since admission temperature of 100.9.  Given 1 dose of ceftriaxone. She was asymptomatic on recent admission, however she is now symptomatic from bacteriuria (urgency,fever) Urine cultures from 7/24 growing Proteus mirabilis sensitive to cefazolin.  - Cefalexin day 1/10  - CBC in  am   #Atrial fibrillation Continue Eliquis 2.5 mg twice daily  #Hyperlipidemia Continue atorvastatin 20 mg  #Hypertension 244-010 systolic overnight. Norvasc 5 mg  #History of CVA Continue aspirin 81 mg daily and atorvastatin as above  Dispo: Anticipated discharge in approximately 1-2 day(s).   VTE prophx: Eliquis Diet: Heart ,Thins Code: DNR  Tamsen Snider, MD PGY1  [phone]

## 2018-10-06 NOTE — ED Notes (Signed)
Placed pt on purewick  

## 2018-10-07 ENCOUNTER — Other Ambulatory Visit: Payer: Self-pay

## 2018-10-07 ENCOUNTER — Emergency Department (HOSPITAL_COMMUNITY)
Admission: EM | Admit: 2018-10-07 | Discharge: 2018-10-08 | Disposition: A | Discharge status: Home / Self Care | Payer: Medicare Other | Attending: Emergency Medicine | Admitting: Emergency Medicine

## 2018-10-07 ENCOUNTER — Encounter (HOSPITAL_COMMUNITY): Payer: Self-pay

## 2018-10-07 DIAGNOSIS — E785 Hyperlipidemia, unspecified: Secondary | ICD-10-CM

## 2018-10-07 DIAGNOSIS — R55 Syncope and collapse: Secondary | ICD-10-CM | POA: Diagnosis not present

## 2018-10-07 DIAGNOSIS — Z5321 Procedure and treatment not carried out due to patient leaving prior to being seen by health care provider: Secondary | ICD-10-CM | POA: Diagnosis not present

## 2018-10-07 DIAGNOSIS — N390 Urinary tract infection, site not specified: Secondary | ICD-10-CM

## 2018-10-07 DIAGNOSIS — N10 Acute tubulo-interstitial nephritis: Secondary | ICD-10-CM | POA: Diagnosis not present

## 2018-10-07 DIAGNOSIS — Z66 Do not resuscitate: Secondary | ICD-10-CM

## 2018-10-07 DIAGNOSIS — Z7901 Long term (current) use of anticoagulants: Secondary | ICD-10-CM

## 2018-10-07 DIAGNOSIS — I10 Essential (primary) hypertension: Secondary | ICD-10-CM

## 2018-10-07 DIAGNOSIS — R3915 Urgency of urination: Secondary | ICD-10-CM | POA: Diagnosis not present

## 2018-10-07 DIAGNOSIS — Z7982 Long term (current) use of aspirin: Secondary | ICD-10-CM

## 2018-10-07 DIAGNOSIS — F0390 Unspecified dementia without behavioral disturbance: Secondary | ICD-10-CM

## 2018-10-07 DIAGNOSIS — I4891 Unspecified atrial fibrillation: Secondary | ICD-10-CM

## 2018-10-07 DIAGNOSIS — Z79899 Other long term (current) drug therapy: Secondary | ICD-10-CM

## 2018-10-07 LAB — BASIC METABOLIC PANEL
Anion gap: 9 (ref 5–15)
BUN: 20 mg/dL (ref 8–23)
CO2: 26 mmol/L (ref 22–32)
Calcium: 8.9 mg/dL (ref 8.9–10.3)
Chloride: 105 mmol/L (ref 98–111)
Creatinine, Ser: 0.81 mg/dL (ref 0.44–1.00)
GFR calc Af Amer: >60 mL/min (ref >60)
GFR calc non Af Amer: >60 mL/min (ref >60)
Glucose, Bld: 102 mg/dL — ABNORMAL HIGH (ref 70–99)
Potassium: 3.9 mmol/L (ref 3.5–5.1)
Sodium: 140 mmol/L (ref 135–145)

## 2018-10-07 LAB — URINALYSIS, ROUTINE W REFLEX MICROSCOPIC
Bacteria, UA: NONE SEEN
Bilirubin Urine: NEGATIVE
Glucose, UA: NEGATIVE mg/dL
Hgb urine dipstick: NEGATIVE
Ketones, ur: NEGATIVE mg/dL
Nitrite: NEGATIVE
Protein, ur: NEGATIVE mg/dL
Specific Gravity, Urine: 1.008 (ref 1.005–1.030)
pH: 6 (ref 5.0–8.0)

## 2018-10-07 LAB — URINE CULTURE: Culture: <10000 — AB

## 2018-10-07 LAB — CBC
HCT: 33.4 % — ABNORMAL LOW (ref 36.0–46.0)
HCT: 33.2 % — ABNORMAL LOW (ref 36.0–46.0)
Hemoglobin: 11 g/dL — ABNORMAL LOW (ref 12.0–15.0)
Hemoglobin: 11 g/dL — ABNORMAL LOW (ref 12.0–15.0)
MCH: 31.2 pg (ref 26.0–34.0)
MCH: 31.3 pg (ref 26.0–34.0)
MCHC: 32.9 g/dL (ref 30.0–36.0)
MCHC: 33.1 g/dL (ref 30.0–36.0)
MCV: 94.6 fL (ref 80.0–100.0)
MCV: 94.6 fL (ref 80.0–100.0)
Platelets: 180 10*3/uL (ref 150–400)
Platelets: 183 10*3/uL (ref 150–400)
RBC: 3.53 MIL/uL — ABNORMAL LOW (ref 3.87–5.11)
RBC: 3.51 MIL/uL — ABNORMAL LOW (ref 3.87–5.11)
RDW: 14.4 % (ref 11.5–15.5)
RDW: 14.3 % (ref 11.5–15.5)
WBC: 8.5 10*3/uL (ref 4.0–10.5)
WBC: 8.3 10*3/uL (ref 4.0–10.5)
nRBC: 0 % (ref 0.0–0.2)
nRBC: 0 % (ref 0.0–0.2)

## 2018-10-07 MED ORDER — CEPHALEXIN 500 MG PO CAPS: 500.0000 mg | ORAL_CAPSULE | Freq: Two times a day (BID) | ORAL | 0 refills | Status: AC

## 2018-10-07 MED ORDER — SODIUM CHLORIDE 0.9% FLUSH: 3.0000 mL | Freq: Once | INTRAVENOUS | Status: DC

## 2018-10-07 MED FILL — CEPHALEXIN 500 MG CAPSULE: 500 | 9 days supply | Qty: 18 | Fill #0

## 2018-10-07 NOTE — Progress Notes (Signed)
  Date: 10/07/2018  Patient name: Sierra Burch  Medical record number: [id number]  Date of birth: [birth year]   I have seen and evaluated this patient and I have discussed the plan of care with the house staff. Please see their note for complete details. I concur with their findings with the following additions/corrections:   Mental status improving with treatment of her pyelonephritis, transitioned to oral cephalexin with no further fevers. Planning discharge today after PT/OT evaluation.   Lenice Pressman, M.D., Ph.D. 10/07/2018, 2:07 PM

## 2018-10-07 NOTE — Evaluation (Addendum)
Physical Therapy Evaluation/ Discharge Patient Details Name: Sierra Burch MRN: [id number] DOB: 02-06-1931 Today's Date: 10/07/2018   History of Present Illness  85 yo admitted to ED 7/24 with D/C 7/25 due to fall. Returned 7/26 wtih inability to void and pain with UTI. PMhx: dementia, Afib, HTN, HLD, CVA  Clinical Impression  Pt and daughter present in room with daughter providing PLOF and home setup. Pt was living in [place] with daughter and attending adult daycare until Covid 19 when programs closed and since that time she has been living with daughters on a rotational basis. Pt also apparently saw a snake at last daughter's house and walked over 5 miles on her own to get away from the house. Pt requires 24hr supervision for cognition and safety and benefits from RW use for all mobility to increase endurance and balance to decrease fall risk. Pt currently at baseline functional status with all education provided and no further therapy needs at this time with pt and daughter aware and agreeable. Encouraged continued mobility and gait with RW acutely and upon return home with maintained guarding for stairs.  HR 91 SpO2 97% on Ra    Follow Up Recommendations No PT follow up;Supervision/Assistance - 24 hour    Equipment Recommendations  3in1 (PT);Other (comment)(tub bench)    Recommendations for Other Services       Precautions / Restrictions Precautions Precautions: Fall Restrictions Weight Bearing Restrictions: No      Mobility  Bed Mobility Overal bed mobility: Needs Assistance Bed Mobility: Supine to Sit     Supine to sit: Supervision     General bed mobility comments: increased time and cues to initiate with pt slightly impulsive and also trying to move when table in the way  Transfers Overall transfer level: Needs assistance Equipment used: 1 person hand held assist Transfers: Sit to/from Stand;Stand Pivot Transfers Sit to Stand: Min guard Stand pivot transfers: Min  guard       General transfer comment: min guard for safety with cues  Ambulation/Gait Ambulation/Gait assistance: Min guard Gait Distance (Feet): 300 Feet Assistive device: Rolling walker (2 wheeled);None Gait Pattern/deviations: Step-through pattern;Decreased stride length;Trunk flexed   Gait velocity interpretation: 1.31 - 2.62 ft/sec, indicative of limited community ambulator General Gait Details: pt walked 150' with RW with cues for direction and to avoid obstacles. last 150' without RW with increased shuffling, slower speed and tendency to reach for rail on wall for support. Daughter and pt recommended to continue RW use for increased stability  Stairs Stairs: Yes Stairs assistance: Min assist Stair Management: Step to pattern;Sideways;One rail Left Number of Stairs: 6 General stair comments: pt using bil UE on rail to ascend and descend stairs with min pelvic support for balance and stability. Daughter states she typically uses rail and HHA and encouraged bil UE use on rail for increased safety of pt and caregiver  Wheelchair Mobility    Modified Rankin (Stroke Patients Only)       Balance Overall balance assessment: Needs assistance Sitting-balance support: No upper extremity supported;Feet supported Sitting balance-Leahy Scale: Good     Standing balance support: No upper extremity supported;Bilateral upper extremity supported Standing balance-Leahy Scale: Fair Standing balance comment: preference to UE support                             Pertinent Vitals/Pain Pain Assessment: No/denies pain    Home Living Family/patient expects to be discharged to:: Private residence Living Arrangements:  Children Available Help at Discharge: Family;Available 24 hours/day Type of Home: House Home Access: Stairs to enter   [employer] of Steps: 1 Home Layout: Two level Home Equipment: Walker - 2 wheels;Shower seat;Toilet riser Additional Comments: was  attending day program but those are closed due to covid    Prior Function Level of Independence: Needs assistance   Gait / Transfers Assistance Needed: reports intermittent use of RW  ADL's / Homemaking Assistance Needed: supervision for all ADLs, family for all homemaking, use of adult briefs for toileting  Comments: poor awareness and STM     Hand Dominance   Dominant Hand: Right    Extremity/Trunk Assessment   Upper Extremity Assessment Upper Extremity Assessment: Generalized weakness    Lower Extremity Assessment Lower Extremity Assessment: Generalized weakness    Cervical / Trunk Assessment Cervical / Trunk Assessment: Kyphotic  Communication   Communication: No difficulties  Cognition Arousal/Alertness: Awake/alert Behavior During Therapy: WFL for tasks assessed/performed Overall Cognitive Status: History of cognitive impairments - at baseline                                 General Comments: pt with no orientation to time, problem solving with daughter present to confirm baseline      General Comments      Exercises     Assessment/Plan    PT Assessment Patent does not need any further PT services  PT Problem List Decreased strength;Decreased activity tolerance;Decreased balance;Decreased mobility;Decreased knowledge of use of DME;Decreased safety awareness       PT Treatment Interventions      PT Goals (Current goals can be found in the Care Plan section)  Acute Rehab PT Goals Patient Stated Goal: to go home PT Goal Formulation: All assessment and education complete, DC therapy    Frequency     Barriers to discharge        Co-evaluation               AM-PAC PT "6 Clicks" Mobility  Outcome Measure Help needed turning from your back to your side while in a flat bed without using bedrails?: A Little Help needed moving from lying on your back to sitting on the side of a flat bed without using bedrails?: A Little Help needed  moving to and from a bed to a chair (including a wheelchair)?: A Little Help needed standing up from a chair using your arms (e.g., wheelchair or bedside chair)?: A Little Help needed to walk in hospital room?: A Little Help needed climbing 3-5 steps with a railing? : A Little 6 Click Score: 18    End of Session Equipment Utilized During Treatment: Gait belt Activity Tolerance: Patient tolerated treatment well Patient left: in chair;with call bell/phone within reach;with chair alarm set;with family/visitor present Nurse Communication: Mobility status PT Visit Diagnosis: Unsteadiness on feet (R26.81);Other abnormalities of gait and mobility (R26.89)    Time: 0752-0826 PT Time Calculation (min) (ACUTE ONLY): 34 min   Charges:   PT Evaluation $PT Eval Moderate Complexity: 1 Mod PT Treatments $Gait Training: 8-22 mins        Glinda Natzke Pam Drown, PT Acute Rehabilitation Services Pager: [phone] Office: [phone]   Makensey Rego B Navada Osterhout 10/07/2018, 12:09 PM

## 2018-10-07 NOTE — Plan of Care (Signed)
  Problem: Activity: Goal: Risk for activity intolerance will decrease Outcome: Progressing   Problem: Safety: Goal: Ability to remain free from injury will improve Outcome: Progressing   

## 2018-10-07 NOTE — Evaluation (Signed)
Occupational Therapy Evaluation Patient Details Name: Sierra Burch MRN: [id number] DOB: 11-14-1930 Today's Date: 10/07/2018    History of Present Illness 85 yo admitted to ED 7/24 with D/C 7/25 due to fall. Returned 7/26 wtih inability to void and pain with UTI. PMhx: dementia, Afib, HTN, HLD, CVA   Clinical Impression   Patient seated in recliner with daughter at side upon entry.  Daughter reports at baseline, patient requires some assist with all ADLs (due to cognition), wears adult briefs, and family completes IADLs.  Patient currently presents at baseline level for ADLs and transfers, daughters biggest concern is tub transfer and safety.  Discussed options and determined tub transfer bench would be safest option, simulated transfer in room x 2 with min guard to supervision assist.  Daughter/patient report no further questions or concerns.  Recommend 24/7 support at discharge, but no further OT needs identified. OT will sign off, if further needs arise please re-consult.     Follow Up Recommendations  No OT follow up;Supervision/Assistance - 24 hour    Equipment Recommendations  Tub/shower bench    Recommendations for Other Services       Precautions / Restrictions Precautions Precautions: Fall Restrictions Weight Bearing Restrictions: No      Mobility Bed Mobility               General bed mobility comments: OOB upon entry   Transfers Overall transfer level: Needs assistance Equipment used: 1 person hand held assist Transfers: Sit to/from Stand Sit to Stand: Min guard         General transfer comment: min guard for safety     Balance Overall balance assessment: Needs assistance Sitting-balance support: No upper extremity supported;Feet supported Sitting balance-Leahy Scale: Good     Standing balance support: During functional activity;Single extremity supported;No upper extremity supported Standing balance-Leahy Scale: Fair Standing balance comment:  preference to UE support                           ADL either performed or assessed with clinical judgement   ADL Overall ADL's : At baseline                         Toilet Transfer: Min guard;Ambulation Toilet Transfer Details (indicate cue type and reason): simulated      Tub/ Shower Transfer: Min guard;Tub transfer;Tub bench;Ambulation Tub/Shower Transfer Details (indicate cue type and reason): simulated tub transfer in room, daughter present and reviewed techniques/safety  Functional mobility during ADLs: Min guard(hand held assist per pt request) General ADL Comments: daughter reports baseline needs assist with ADLs, pt currently at baseline due to cognition-- biggest concern was tub transfers and thoroughly reviewed with pt/daughter     Vision         Perception     Praxis      Pertinent Vitals/Pain Pain Assessment: No/denies pain     Hand Dominance Right   Extremity/Trunk Assessment Upper Extremity Assessment Upper Extremity Assessment: Generalized weakness   Lower Extremity Assessment Lower Extremity Assessment: Defer to PT evaluation   Cervical / Trunk Assessment Cervical / Trunk Assessment: Normal   Communication Communication Communication: No difficulties   Cognition Arousal/Alertness: Awake/alert Behavior During Therapy: WFL for tasks assessed/performed Overall Cognitive Status: History of cognitive impairments - at baseline  General Comments: hx of dementia   General Comments       Exercises     Shoulder Instructions      Home Living Family/patient expects to be discharged to:: Private residence Living Arrangements: Children Available Help at Discharge: Family;Available 24 hours/day;Available PRN/intermittently Type of Home: House Home Access: Stairs to enter Entrance Stairs-Number of Steps: 1   Home Layout: Two level Alternate Level Stairs-Number of Steps:  flight Alternate Level Stairs-Rails: Right Bathroom Shower/Tub: Chief Strategy OfficerTub/shower unit   Bathroom Toilet: Standard     Home Equipment: Environmental consultantWalker - 2 wheels;Shower seat;Toilet riser   Additional Comments: was attending day program but those are closed due to covid      Prior Functioning/Environment Level of Independence: Needs assistance  Gait / Transfers Assistance Needed: reports intermittent use of RW ADL's / Homemaking Assistance Needed: requires some assist for ADLS, family assist with IADls, uses adult briefs for toileting    Comments: poor awareness and STM        OT Problem List: Decreased strength;Decreased activity tolerance;Impaired balance (sitting and/or standing);Decreased knowledge of use of DME or AE      OT Treatment/Interventions:      OT Goals(Current goals can be found in the care plan section) Acute Rehab OT Goals Patient Stated Goal: to go home OT Goal Formulation: With patient/family  OT Frequency:     Barriers to D/C:            Co-evaluation              AM-PAC OT "6 Clicks" Daily Activity     Outcome Measure Help from another person eating meals?: A Little Help from another person taking care of personal grooming?: A Little Help from another person toileting, which includes using toliet, bedpan, or urinal?: A Little Help from another person bathing (including washing, rinsing, drying)?: A Little Help from another person to put on and taking off regular upper body clothing?: A Little Help from another person to put on and taking off regular lower body clothing?: A Little 6 Click Score: 18   End of Session Nurse Communication: Mobility status  Activity Tolerance: Patient tolerated treatment well Patient left: in chair;with call bell/phone within reach;with chair alarm set  OT Visit Diagnosis: Other abnormalities of gait and mobility (R26.89);Muscle weakness (generalized) (M62.81)                Time: [id number] OT Time Calculation (min): 20  min Charges:  OT General Charges $OT Visit: 1 Visit OT Evaluation $OT Eval Low Complexity: 1 Low  Chancy Milroyhristie S Safiyah Cisney, OT Acute Rehabilitation Services Pager [phone] Office [phone]   Chancy MilroyChristie S Borden Thune 10/07/2018, 9:38 AM

## 2018-10-07 NOTE — Discharge Summary (Addendum)
Name: Sierra Burch MRN: [id number] DOB: 04-16-1930 85 y.o. PCP: System, Pcp Not In  Date of Admission: 10/05/2018  7:42 PM Date of Discharge: 10/07/18 Attending Physician: Anne Shutteraines, Alexander N, MD  Discharge Diagnosis: 1. Acute Pyelonephritis   Discharge Medications: Allergies as of 10/07/2018   No Known Allergies      Medication List     TAKE these medications    alendronate 70 MG tablet Commonly known as: FOSAMAX Take 70 mg by mouth once a week. Tuesday   amLODipine 5 MG tablet Commonly known as: NORVASC Take 5 mg by mouth daily.   aspirin 81 MG chewable tablet Chew 81 mg by mouth daily.   atorvastatin 20 MG tablet Commonly known as: LIPITOR Take 20 mg by mouth daily.   cephALEXin 500 MG capsule Commonly known as: KEFLEX Take 1 capsule (500 mg total) by mouth 2 (two) times daily for 9 days.   Eliquis 2.5 MG Tabs tablet Generic drug: apixaban Take 2.5 mg by mouth 2 (two) times daily.   lisinopril 5 MG tablet Commonly known as: ZESTRIL Take 5 mg by mouth daily.   Vitamin D 50 MCG (2000 UT) Caps Take 5,000 Units by mouth daily.               Durable Medical Equipment  (From admission, onward)           Start     Ordered   10/07/18 1440  DME tub bench  Once     10/07/18 1448   10/07/18 1439  DME 3-in-1  Once     10/07/18 1448            Disposition and follow-up:   [name] [facility] St[facility].  At the hospital follow up visit please address:  1.  Symptoms of pyelonephritis, Patient having urgency and fever on admission day. Patient was sent home with Keflex to finish 10 day course.     2.  Labs / imaging needed at time of follow-up:   3.  Pending labs/ test needing follow-up:   Follow-up Appointments:    Hospital Course by problem list: 701. 85 year old woman with a history of dementia admitted to our service on 7/24 for orthostatic hypotension causing syncope, discharged  on 7/25 after IV fluids and resolution of the orthostatics, who presented back to the emergency department only a few hours after discharge with a new onset fever.  The patient reported feeling well currently, has a little lower abdominal pain, denies nausea or vomiting, denies flank pain.  She is accompanied by her daughter, who reports that the patient was still unsteady on her feet at home, she was worried that she was going to fall.  Patient had a sensation of urinary urgency, try to get to the bathroom by herself several times yesterday and often unable to pass urine.  Daughter noticed that her skin felt warm, checked her temperature and noted to be 100.4. Patient 100.8 in ED and given one dose ceftriaxone. Urine culture from recent admission resulted the next day and patient started on Keflex for Proteus Mirabilis.  Sent home with 9 days supply to finish 10 day course.      Discharge Vitals:   BP 112/65   Pulse 77   Temp 99 F (37.2 C) (Oral)   Resp 17   Ht 5\' 2"  (1.575 m)   Wt 53.1 kg   SpO2 97%   BMI 21.42 kg/m   Pertinent Labs,  Studies, and Procedures:    CBC Latest Ref Rng & Units 10/07/2018 10/06/2018 10/05/2018  WBC 4.0 - 10.5 K/uL 8.5 11.5(H) 15.3(H)  Hemoglobin 12.0 - 15.0 g/dL 11.0(L) 11.7(L) 13.0  Hematocrit 36.0 - 46.0 % 33.4(L) 35.5(L) 39.9  Platelets 150 - 400 K/uL 180 181 212    CMP Latest Ref Rng & Units 10/05/2018 10/05/2018 10/04/2018  Glucose 70 - 99 mg/dL 114(H) 110(H) 77  BUN 8 - 23 mg/dL 21 13 20   Creatinine 0.44 - 1.00 mg/dL 0.86 0.79 0.84  Sodium 135 - 145 mmol/L 138 137 141  Potassium 3.5 - 5.1 mmol/L 4.2 3.5 4.1  Chloride 98 - 111 mmol/L 102 102 107  CO2 22 - 32 mmol/L 27 26 20(L)  Calcium 8.9 - 10.3 mg/dL 9.6 9.2 9.1  Total Protein 6.5 - 8.1 g/dL 8.0 - -  Total Bilirubin 0.3 - 1.2 mg/dL 0.9 - -  Alkaline Phos 38 - 126 U/L 70 - -  AST 15 - 41 U/L 17 - -  ALT 0 - 44 U/L 16 - -   Urinalysis    Component Value Date/Time   COLORURINE YELLOW 10/06/2018  0055   APPEARANCEUR CLEAR 10/06/2018 0055   LABSPEC 1.016 10/06/2018 0055   PHURINE 6.0 10/06/2018 0055   GLUCOSEU NEGATIVE 10/06/2018 0055   HGBUR NEGATIVE 10/06/2018 0055   BILIRUBINUR NEGATIVE 10/06/2018 0055   KETONESUR 5 (A) 10/06/2018 0055   PROTEINUR NEGATIVE 10/06/2018 0055   NITRITE NEGATIVE 10/06/2018 0055   LEUKOCYTESUR TRACE (A) 10/06/2018 0055   Results for orders placed or performed during the hospital encounter of 10/05/18  Blood Culture (routine x 2)     Status: None (Preliminary result)   Collection Time: 10/05/18 10:02 PM   Specimen: BLOOD  Result Value Ref Range Status   Specimen Description BLOOD SITE NOT SPECIFIED  Final   Special Requests   Final    BOTTLES DRAWN AEROBIC AND ANAEROBIC Blood Culture adequate volume   Culture   Final    NO GROWTH 2 DAYS Performed at [facility], [address] [place], [place] [address]    Report Status PENDING  Incomplete  SARS Coronavirus 2 (CEPHEID - Performed in [place] hospital lab), Hosp Order     Status: None   Collection Time: 10/05/18 10:09 PM   Specimen: Nasopharyngeal Swab  Result Value Ref Range Status   SARS Coronavirus 2 NEGATIVE NEGATIVE Final    Comment: (NOTE) If result is NEGATIVE SARS-CoV-2 target nucleic acids are NOT DETECTED. The SARS-CoV-2 RNA is generally detectable in upper and lower  respiratory specimens during the acute phase of infection. The lowest  concentration of SARS-CoV-2 viral copies this assay can detect is 250  copies / mL. A negative result does not preclude SARS-CoV-2 infection  and should not be used as the sole basis for treatment or other  patient management decisions.  A negative result may occur with  improper specimen collection / handling, submission of specimen other  than nasopharyngeal swab, presence of viral mutation(s) within the  areas targeted by this assay, and inadequate number of viral copies  (<250 copies / mL). A negative result must be combined with  clinical  observations, patient history, and epidemiological information. If result is POSITIVE SARS-CoV-2 target nucleic acids are DETECTED. The SARS-CoV-2 RNA is generally detectable in upper and lower  respiratory specimens dur ing the acute phase of infection.  Positive  results are indicative of active infection with SARS-CoV-2.  Clinical  correlation with  patient history and other diagnostic information is  necessary to determine patient infection status.  Positive results do  not rule out bacterial infection or co-infection with other viruses. If result is PRESUMPTIVE POSTIVE SARS-CoV-2 nucleic acids MAY BE PRESENT.   A presumptive positive result was obtained on the submitted specimen  and confirmed on repeat testing.  While 2019 novel coronavirus  (SARS-CoV-2) nucleic acids may be present in the submitted sample  additional confirmatory testing may be necessary for epidemiological  and / or clinical management purposes  to differentiate between  SARS-CoV-2 and other Sarbecovirus currently known to infect humans.  If clinically indicated additional testing with an alternate test  methodology [phone]([id number]) is advised. The SARS-CoV-2 RNA is generally  detectable in upper and lower respiratory sp ecimens during the acute  phase of infection. The expected result is Negative. Fact Sheet for Patients:  BoilerBrush.com.cyhttps://www.fda.gov/media/136312/download Fact Sheet for Healthcare Providers: https://pope.com/https://www.fda.gov/media/136313/download This test is not yet approved or cleared by the [place] FDA and has been authorized for detection and/or diagnosis of SARS-CoV-2 by FDA under an Emergency Use Authorization (EUA).  This EUA will remain in effect (meaning this test can be used) for the duration of the COVID-19 declaration under Section 564(b)(1) of the Act, 21 U.S.C. section 360bbb-3(b)(1), unless the authorization is terminated or revoked sooner. Performed at [facility], [address] [place], [place] [address]   Blood Culture (routine x 2)     Status: None (Preliminary result)   Collection Time: 10/05/18 11:18 PM   Specimen: BLOOD LEFT HAND  Result Value Ref Range Status   Specimen Description BLOOD LEFT HAND  Final   Special Requests   Final    BOTTLES DRAWN AEROBIC ONLY Blood Culture results may not be optimal due to an inadequate volume of blood received in culture bottles   Culture   Final    NO GROWTH 2 DAYS Performed at [facility], [address] [place], [place] [address]    Report Status PENDING  Incomplete  Urine culture     Status: Abnormal   Collection Time: 10/06/18  1:00 AM   Specimen: In/Out Cath Urine  Result Value Ref Range Status   Specimen Description IN/OUT CATH URINE  Final   Special Requests NONE  Final   Culture (A)  Final    <10,000 COLONIES/mL INSIGNIFICANT GROWTH Performed at [facility], [address] [place], [place] [address]    Report Status 10/07/2018 FINAL  Final     Discharge Instructions: Discharge Instructions     Call MD for:  severe uncontrolled pain   Complete by: As directed    Call MD for:  temperature >100.4   Complete by: As directed    Diet - low sodium heart healthy   Complete by: As directed    Discharge instructions   Complete by: As directed    Ms. Toni Burch,  It was my pleasure to take part in your care. You were found to have a urinary tract infection and given one dose of IV antibiotics. You were transitioned to oral antibiotics , which you will go home with. Please take the antibiotics as directed. Take your first dose tonight around 8-10 pm.   My best,  Dr.Chris Narasimhan   Increase activity slowly   Complete by: As directed        Signed:  Thurmon FairJeff Leeyah Heather, MD PGY1  [phone]

## 2018-10-07 NOTE — Progress Notes (Addendum)
   Subjective: Spoke with patient and daughter at bedside this morning. Daughter states that patient is doing well today. He patient has had some periods where she though she might be in a different city, which her daughter believes is new despite her known dementia.   The PT went well per her daughter and she states that her mother's strength appears to be returning.  Some difficulty in getting to the bathroom in time because she has urgency when she has to go and it is difficult to get to her in time to help a times (especially for the staff here given that that cannot come immediately)  Objective:  Vital signs in last 24 hours: Vitals:   10/06/18 0913 10/06/18 1202 10/06/18 2107 10/07/18 0429  BP: 131/69 127/69 (!) 124/59 138/71  Pulse: 77 74 82 71  Resp: 18 18 16 18   Temp: 98.3 F (36.8 C) 98 F (36.7 C) 98.8 F (37.1 C) 98.3 F (36.8 C)  TempSrc: Oral Oral Oral Oral  SpO2:  94% 97% 96%  Weight:    53.1 kg  Height:       Physical Exam Constitutional:      General: She is not in acute distress.    Appearance: Normal appearance.  Cardiovascular:     Rate and Rhythm: Normal rate and regular rhythm.  Pulmonary:     Effort: Pulmonary effort is normal.     Breath sounds: Normal breath sounds.  Abdominal:     General: Bowel sounds are normal.     Palpations: Abdomen is soft.  Genitourinary:    Comments: No costovertebral tenderness. No pain on suprapubic palpation. Neurological:     Comments: Alert, oreiented to self , but not time (answers its 2002), not to place ( knows she is in hospital but thinks it is in [place]. Pleasant, this is patients baseline daughter adds.     Assessment/Plan:  Principal Problem:   UTI (urinary tract infection) Active Problems:   Syncope   Fall   Dementia [facility])  85 year old female with chronic dementia admitted with fever, urgency, and decrease functional status due to likely urinary tract infection.  #Urinary tract infection Patient  has remained afebrile since initially temperature of 100.9 on admit. She recieved 1 dose of ceftriaxone in ED and was transitioned to oral Cefalexin. She did have asymptomatic bacteruria during recent admission, but became symptomatic with urgency and fever after discharge/ Urine cultures from 7/24 growing Proteus mirabilis sensitive to cefazolin. WBC downtrended and afebrile , possible discharge this afternoon. - OT recommends Tub/shower bench and no OT follow-up - Cefalexin day 2/10   #Hypertension: normotensive  - Norvasc 5 mg  #History of CVA: Continue aspirin 81 mg daily and atorvastatin as above #Atrial fibrillation:  Continue Eliquis 2.5 mg twice daily #Hyperlipidemia: Continue atorvastatin 20 mg  Dispo: Anticipated discharge in approximately 0-1 day(s).   VTE prophx: Eliquis Diet: Heart ,Thins Code: DNR  Tamsen Snider, MD PGY1  [phone]

## 2018-10-07 NOTE — ED Triage Notes (Signed)
[facility]- pt coming from home, was just d/c from the hospital earlier today. She was sitting in a chair and daughter found her slumped over but alert. No LOC per patient. She is alert and oriented, 12 lead unremarkable.   HR 69 148/80 CBG 102

## 2018-10-07 NOTE — Discharge Instructions (Signed)

## 2018-10-08 NOTE — ED Notes (Signed)
Pt is leaving against advice to stay. Pt stated that they will follow up with PCP

## 2018-10-08 NOTE — ED Notes (Signed)
Pt called 2x with no answer.  Pt not physically in waiting room.  All pt names were checked prior to moving off floor.

## 2018-10-10 LAB — CULTURE, BLOOD (ROUTINE X 2)
Culture: NO GROWTH
Culture: NO GROWTH
Special Requests: ADEQUATE

## 2018-10-15 NOTE — Progress Notes (Signed)
Subjective:    Patient ID: Sierra Burch, female    DOB: 1930/10/01, 85 y.o.   MRN: [id number]  This is an 85 year old black female who was admitted on 25 July discharged on the 27th for acute pyelonephritis the patient is now finishing a course of antibiotics and is actually already had follow-up with [facility]   The patient had previously been in the emergency room on 24 July with a fall and laceration to the back of the head at that time CT of the head was negative and the scalp was repaired with staples.  The staples have been in for 12 days and need to now be removed as well.  Note the initial pyelonephritis was due to Proteus mirabilis which was sensitive to Keflex  Discharge summary is as below  Date of Admission: 10/05/2018  7:42 PM Date of Discharge: 10/07/18 Attending Physician: Anne Shutteraines, Alexander N, MD  Discharge Diagnosis: 1. Acute Pyelonephritis   Disposition and follow-up:   [name] [facility].  At the hospital follow up visit please address:  1.  Symptoms of pyelonephritis, Patient having urgency and fever on admission day. Patient was sent home with Keflex to finish 10 day course.     2.  Labs / imaging needed at time of follow-up:   3.  Pending labs/ test needing follow-up:   Follow-up Appointments:    Hospital Course by problem list: 1.44 year oldwomanwith a history of dementia admitted to our service on 7/24 for orthostatic hypotension causing syncope, discharged on 7/25after IV fluids and resolution of the orthostatics, who presented back to the emergency department only a few hours after discharge with a new onset fever. The patient reported feeling well currently, has a little lower abdominal pain, denies nausea or vomiting, denies flank pain. She is accompanied by her daughter, who reports that the patient was still unsteady on her feet at  home, she was worried that she was going to fall. Patient had a sensation of urinary urgency, try to get to the bathroom by herself several times yesterday and often unable to pass urine. Daughter noticed that her skin felt warm, checked her temperature and noted to be 100.4. Patient 100.8 in ED and given one dose ceftriaxone. Urine culture from recent admission resulted the next day and patient started on Keflex for Proteus Mirabilis.  Sent home with 9 days supply to finish 10 day course.     Discharge Diagnosis: 1.Syncope 2. Asymptomatic bacteriuria   Hospital Course by problem list:   #Syncope Patient had a syncopal episode this morning after a fall in her bathroom which caused her to hit the back of her head. She lost consciousness for a few minutes. CT head without any acute intracranial abnormalities. CT cervical spine showing C5-6, C6-7 with degenerative changes. Patient monitored on telemetry and in sinus rhythm overnight. Orthostatic lying to sitting, drop in systolic pressure 139>118.Patient with good p.o. intake this morning and spoke to daughter about plan to remind her mother to drink more fluids throughout the day.   #Asymptomatic bacteriuria UA from yesterday showed negative nitrites moderate leukocytes rare bacteria. Urine culture with preliminary results of 100,000 gram-negative rods.Patient is asymptomatic. Denies dysuria abdominal pain fevers chills, and WBC is normal. Without other signs/symptoms likely a chronic colonization and patient would not benefit from antibiotics.   [place] secondary to COVID-19 pandemic restrictions.  The patient has significant dementia.  Overall she is improved and does not have active urinary tract symptoms.  She is requesting to have the sutures removed    Past Medical History:  Diagnosis Date   A-fib [facility])    Acute pyelonephritis 10/06/2018   Dementia ([place])     Hyperlipidemia    Hypertension    Stroke [facility])    Syncope 10/04/2018     History reviewed. No pertinent family history.   Social History   Socioeconomic History   Marital status: Widowed    Spouse name: Not on file   Number of children: Not on file   Years of education: Not on file   Highest education level: Not on file  Occupational History   Not on file  Social Needs   Financial resource strain: Not on file   Food insecurity    Worry: Not on file    Inability: Not on file   Transportation needs    Medical: Not on file    Non-medical: Not on file  Tobacco Use   Smoking status: Never Smoker   Smokeless tobacco: Never Used  Substance and Sexual Activity   Alcohol use: Not Currently   Drug use: Not Currently   Sexual activity: Not on file  Lifestyle   Physical activity    Days per week: Not on file    Minutes per session: Not on file   Stress: Not on file  Relationships   Social connections    Talks on phone: Not on file    Gets together: Not on file    Attends religious service: Not on file    Active member of club or organization: Not on file    Attends meetings of clubs or organizations: Not on file    Relationship status: Not on file   Intimate partner violence    Fear of current or ex partner: Not on file    Emotionally abused: Not on file    Physically abused: Not on file    Forced sexual activity: Not on file  Other Topics Concern   Not on file  Social History Narrative   Not on file     No Known Allergies   Outpatient Medications Prior to Visit  Medication Sig Dispense Refill   alendronate (FOSAMAX) 70 MG tablet Take 70 mg by mouth once a week. Tuesday  1   amLODipine (NORVASC) 5 MG tablet Take 5 mg by mouth daily.   1   apixaban (ELIQUIS) 2.5 MG TABS tablet Take 2.5 mg by mouth 2 (two) times daily.      aspirin 81 MG chewable tablet Chew 81 mg by mouth daily.     atorvastatin (LIPITOR) 20 MG tablet Take 20 mg by mouth  daily.  2   cephALEXin (KEFLEX) 500 MG capsule Take 1 capsule (500 mg total) by mouth 2 (two) times daily for 9 days. 18 capsule 0   Cholecalciferol (VITAMIN D) 2000 units CAPS Take 5,000 Units by mouth daily.      lisinopril (PRINIVIL,ZESTRIL) 5 MG tablet Take 5 mg by mouth daily.  2   No facility-administered medications prior to visit.     Review of Systems Constitutional:   No  weight loss, night sweats,  Fevers, chills, fatigue, lassitude. HEENT:   No headaches,  Difficulty swallowing,  Tooth/dental problems,  Sore throat,                No sneezing, itching, ear ache, nasal congestion, post nasal  drip,   CV:  No chest pain,  Orthopnea, PND, swelling in lower extremities, anasarca, dizziness, palpitations  GI  No heartburn, indigestion, abdominal pain, nausea, vomiting, diarrhea, change in bowel habits, loss of appetite  Resp: No shortness of breath with exertion or at rest.  No excess mucus, no productive cough,  No non-productive cough,  No coughing up of blood.  No change in color of mucus.  No wheezing.  No chest wall deformity  Skin: no rash or lesions.  GU: no dysuria, change in color of urine, no urgency or frequency.  No flank pain.  MS:  No joint pain or swelling.  No decreased range of motion.  No back pain.  Psych:  No change in mood or affect. No depression or anxiety.  No memory loss.     Objective:   Physical Exam Vitals:   10/16/18 1114  BP: 135/75  Pulse: 70  Resp: 16  Temp: 98 F (36.7 C)  TempSrc: Oral  SpO2: 99%  Weight: 124 lb 9.6 oz (56.5 kg)  Height: 5' 0.5" (1.537 m)    Gen: Pleasant,thin, in no distress,  normal affect  ENT: No lesions,  mouth clear,  oropharynx clear, no postnasal drip  There are 4 staples in a healed incision at the base of the skull posteriorly at the top of the neck  Neck: No JVD, no TMG, no carotid bruits  Lungs: No use of accessory muscles, no dullness to percussion, clear without rales or  rhonchi  Cardiovascular: RRR, heart sounds normal, no murmur or gallops, no peripheral edema  Abdomen: soft and NT, no HSM,  BS normal  Musculoskeletal: No deformities, no cyanosis or clubbing  Neuro: alert, non focal  Skin: Warm, no lesions or rashes  All labs and cultures from recent hospitalization were reviewed  I removed all 4 staples from the patient using a staple removal device no Steri-Strips were needed      Assessment & Plan:  I personally reviewed all images and lab data in the [facility] system as well as any outside material available during this office visit and agree with the  radiology impressions.   Scalp laceration Fall on July 24 with scalp laceration now healed and scalp laceration staples were removed without incident  1 dose of Tylenol 500 mg was given for pain  Fall Recent fall that has now resolved  Acute pyelonephritis Severe UTI and pyelonephritis now resolved  Syncope Syncopal spell secondary to severe infection now resolved  Chronic atrial fibrillation Vital signs are stable management per cardiology and [facility] [place]  Hypertension Blood pressure stable  Maintain lisinopril and amlodipine  Hypercholesterolemia Maintain atorvastatin   Knute Neu[facility]ah was seen today for hospitalization follow-up.  Diagnoses and all orders for this visit:  Laceration of scalp, subsequent encounter  Acute pyelonephritis  Dementia without behavioral disturbance, unspecified dementia type (HCC)  Fall, subsequent encounter  Syncope, unspecified syncope type  Chronic atrial fibrillation  Essential hypertension  Hypercholesterolemia

## 2018-10-16 ENCOUNTER — Encounter: Payer: Self-pay | Admitting: Critical Care Medicine

## 2018-10-16 ENCOUNTER — Ambulatory Visit: Payer: Medicare Other | Attending: Critical Care Medicine | Admitting: Critical Care Medicine

## 2018-10-16 ENCOUNTER — Other Ambulatory Visit: Payer: Self-pay

## 2018-10-16 VITALS — BP 135/75 | HR 70 | Temp 98.0°F | Resp 16 | Ht 60.5 in | Wt 124.6 lb

## 2018-10-16 DIAGNOSIS — S0101XD Laceration without foreign body of scalp, subsequent encounter: Secondary | ICD-10-CM

## 2018-10-16 DIAGNOSIS — R55 Syncope and collapse: Secondary | ICD-10-CM | POA: Diagnosis not present

## 2018-10-16 DIAGNOSIS — W19XXXD Unspecified fall, subsequent encounter: Secondary | ICD-10-CM | POA: Diagnosis not present

## 2018-10-16 DIAGNOSIS — I10 Essential (primary) hypertension: Secondary | ICD-10-CM | POA: Diagnosis not present

## 2018-10-16 DIAGNOSIS — F0390 Unspecified dementia without behavioral disturbance: Secondary | ICD-10-CM

## 2018-10-16 DIAGNOSIS — N10 Acute tubulo-interstitial nephritis: Secondary | ICD-10-CM | POA: Diagnosis not present

## 2018-10-16 DIAGNOSIS — I482 Chronic atrial fibrillation, unspecified: Secondary | ICD-10-CM | POA: Diagnosis not present

## 2018-10-16 DIAGNOSIS — E7800 Pure hypercholesterolemia, unspecified: Secondary | ICD-10-CM

## 2018-10-16 MED ORDER — ACETAMINOPHEN 500 MG PO TABS
500.0000 mg | ORAL_TABLET | Freq: Once | ORAL | Status: AC
  Administered 2018-10-16: 500 mg via ORAL

## 2018-10-16 NOTE — Assessment & Plan Note (Signed)
Blood pressure stable  Maintain lisinopril and amlodipine

## 2018-10-16 NOTE — Patient Instructions (Signed)
Scalp staples were removed (4)  Tylenol 500mg  orally was given  No medication changes, finish keflex  Keep follow up appointments with your PCP and cardiology MDs in [place]  We are available as needed

## 2018-10-16 NOTE — Addendum Note (Signed)
Addended by: Jackelyn Knife on: 10/16/2018 02:11 PM   Modules accepted: Orders

## 2018-10-16 NOTE — Assessment & Plan Note (Signed)
Fall on July 24 with scalp laceration now healed and scalp laceration staples were removed without incident  1 dose of Tylenol 500 mg was given for pain

## 2018-10-16 NOTE — Assessment & Plan Note (Signed)
Vital signs are stable management per cardiology and primary care in [facility]

## 2018-10-16 NOTE — Assessment & Plan Note (Signed)
Maintain atorvastatin 

## 2018-10-16 NOTE — Assessment & Plan Note (Signed)
Recent fall that has now resolved

## 2018-10-16 NOTE — Assessment & Plan Note (Signed)
Severe UTI and pyelonephritis now resolved

## 2018-10-16 NOTE — Assessment & Plan Note (Signed)
Syncopal spell secondary to severe infection now resolved

## 2018-11-21 ENCOUNTER — Ambulatory Visit: Payer: Medicare Other | Attending: Family Medicine | Admitting: Family Medicine

## 2018-11-21 ENCOUNTER — Other Ambulatory Visit: Payer: Self-pay

## 2018-11-21 ENCOUNTER — Encounter: Payer: Self-pay | Admitting: Family Medicine

## 2018-11-21 VITALS — BP 121/63 | HR 60 | Temp 98.5°F | Ht 60.5 in | Wt 125.8 lb

## 2018-11-21 DIAGNOSIS — I482 Chronic atrial fibrillation, unspecified: Secondary | ICD-10-CM

## 2018-11-21 DIAGNOSIS — Z7982 Long term (current) use of aspirin: Secondary | ICD-10-CM | POA: Diagnosis not present

## 2018-11-21 DIAGNOSIS — M19042 Primary osteoarthritis, left hand: Secondary | ICD-10-CM

## 2018-11-21 DIAGNOSIS — N390 Urinary tract infection, site not specified: Secondary | ICD-10-CM | POA: Diagnosis present

## 2018-11-21 DIAGNOSIS — F0390 Unspecified dementia without behavioral disturbance: Secondary | ICD-10-CM | POA: Diagnosis not present

## 2018-11-21 DIAGNOSIS — R269 Unspecified abnormalities of gait and mobility: Secondary | ICD-10-CM | POA: Insufficient documentation

## 2018-11-21 DIAGNOSIS — R2689 Other abnormalities of gait and mobility: Secondary | ICD-10-CM

## 2018-11-21 DIAGNOSIS — D649 Anemia, unspecified: Secondary | ICD-10-CM

## 2018-11-21 DIAGNOSIS — Z87448 Personal history of other diseases of urinary system: Secondary | ICD-10-CM | POA: Diagnosis not present

## 2018-11-21 DIAGNOSIS — Z8673 Personal history of transient ischemic attack (TIA), and cerebral infarction without residual deficits: Secondary | ICD-10-CM | POA: Diagnosis not present

## 2018-11-21 DIAGNOSIS — Z79899 Other long term (current) drug therapy: Secondary | ICD-10-CM | POA: Diagnosis not present

## 2018-11-21 DIAGNOSIS — R05 Cough: Secondary | ICD-10-CM | POA: Diagnosis not present

## 2018-11-21 DIAGNOSIS — Z8249 Family history of ischemic heart disease and other diseases of the circulatory system: Secondary | ICD-10-CM | POA: Diagnosis not present

## 2018-11-21 DIAGNOSIS — Z7901 Long term (current) use of anticoagulants: Secondary | ICD-10-CM | POA: Diagnosis not present

## 2018-11-21 DIAGNOSIS — E785 Hyperlipidemia, unspecified: Secondary | ICD-10-CM | POA: Insufficient documentation

## 2018-11-21 DIAGNOSIS — I10 Essential (primary) hypertension: Secondary | ICD-10-CM | POA: Diagnosis not present

## 2018-11-21 DIAGNOSIS — Z87440 Personal history of urinary (tract) infections: Secondary | ICD-10-CM

## 2018-11-21 DIAGNOSIS — M19041 Primary osteoarthritis, right hand: Secondary | ICD-10-CM

## 2018-11-21 DIAGNOSIS — M170 Bilateral primary osteoarthritis of knee: Secondary | ICD-10-CM | POA: Diagnosis not present

## 2018-11-21 DIAGNOSIS — R55 Syncope and collapse: Secondary | ICD-10-CM | POA: Insufficient documentation

## 2018-11-21 DIAGNOSIS — R059 Cough, unspecified: Secondary | ICD-10-CM

## 2018-11-21 NOTE — Progress Notes (Signed)
Established Patient Office Visit  Subjective:  Patient ID: Sierra Burch, female    DOB: 12-15-1930  Age: 85 y.o. MRN: [id number]  CC:  Chief Complaint  Patient presents with  . Establish Care    HPI Sierra Burch, 85 yo female who is accompanied by 1 of her daughters at today's visit, presents to establish ongoing care while she is here in the [place] area.  Patient is status post recent hospitalization after a syncopal episode with a laceration to the left posterior scalp requiring staples and patient was treated with IV fluids for dehydration and was found to have urinary tract infection/pyelonephritis which was treated.  Patient was seen here in the office by another provider on 10/16/2018 for immediate follow-up after her hospitalization/admission from 10/05/2018 through 10/07/2018.  Patient reports that headache and posterior scalp/neck discomfort associated with prior fall have now resolved.  She denies any current issues with urinary frequency, urgency or dysuria.  No fever or chills.  Patient does have upcoming appointment at the end of the month with the urologist in [facility] where patient had been living prior to recently coming to the area to stay with her daughter.  Daughter reports that family has been having some concerns that patient may have early signs of dementia and that they decided to try and have patient stay with 1 of her 6 daughters for closer supervision.  Daughters also wanted patient to not be alone due to the COVID-19 pandemic as patient's adult daycare program was no longer operating due to COVID-19 and patient was at home alone.  Patient was initially with another daughter that lives in this area but patient saw a snake outside of the home and per daughter, she walked almost 5 miles to seek help after seeing the snake therefore patient is now staying with the daughter who accompanies her to today's visit.        Patient reports that she feels well at today's  visit.  She denies any increased pain but does have chronic bilateral knee and hand pain secondary to arthritis.  Daughter states that patient normally uses a walker for ambulation but in their rush to get today's visit, they forgot to bring the walker.  Patient reports that she has had some prior knee injections for treatment of pain but states that these were a while ago and she thinks that they helped for a little bit.  She cannot remember exactly which types of injection she may have had or how long ago the injections occurred.  Daughter states that patient has been adamant in the past that she does not wish to have any surgical intervention regarding her arthritis in her knees.  Patient does not currently take any medications for her hand or knee pain.  Pain is about a 4-5 on a 0-to-10 scale and sometimes higher with activity or if she has been sitting and then attempts to stand up and start walking as she develops increased knee stiffness after prolonged sitting.  Other than her syncopal episode in July for which she was seen at the ED, patient denies any other recent falls.        Patient does have history of CVA x2 and is currently on Eliquis due to chronic atrial fibrillation.  Patient cannot recall if she has had any residual issues after her prior strokes.  She has had no nosebleeds, no bleeding from the gums when brushing her teeth, no blood in the urine, no blood with bowel  movements and no dark/sticky stools.  Patient has had no blood in the urine.  She denies any chest pain and no sensation of palpitations or increased heart rate.  She reports that she is also on blood pressure medication.  She occasionally has some lower extremity edema.  She has had no recent issues with headaches or dizziness.  Patient currently takes amlodipine and lisinopril and upon questioning/review of systems patient does have a chronic dry cough and she is not sure if this has only started since she has been on lisinopril.   Daughter also reports a patient with history of allergic rhinitis.  Patient does not have any history of tobacco use.  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  Patient reports no surgical history.  Family history consist of her father possibly having diabetes as patient recalls that he had to have 1 of his legs amputated but she is not completely sure that this was due to diabetes.  Her father may also have had stomach cancer.  Patient is 1 of 12 children and she has had siblings with hypertension and has had one brother with a stroke.  Patient has had one sister, Sierra Burch, who possibly had cancer.       Daughter reports that patient will also continue to see her primary care doctor, Sierra Boston, MD in [place], [place] and her cardiologist, Dr. Clent Burch. [facility] in [place], [place].  She wishes to have her mother established with her primary care provider in this area when patient is staying with the daughter or her other sister who lives in the [place] area.  Daughter with the patient at today's visit is not the medical power of attorney but she will discuss the need for influenza immunization with her sister who is the healthcare power of attorney.  Patient has had no issues with disruptive behavior, patient currently sleeps well and has a good appetite.  She does not have any issues with wandering.  Past Medical History:  Diagnosis Date  . A-fib ([place])   . Acute pyelonephritis 10/06/2018  . Dementia ([place])   . Hyperlipidemia   . Hypertension   . Stroke ([place])   . Syncope 10/04/2018    Past Surgical History:  Procedure Laterality Date  . NO PAST SURGERIES      Family History  Problem Relation Age of Onset  . Stomach cancer Father   . Hypertension Brother   . Stroke Brother     Social History   Socioeconomic History  . Marital status: Widowed    Spouse name: Not on file  . Number of children: Not on file  . Years of education: Not on file  . Highest education  level: Not on file  Occupational History  . Not on file  Social Needs  . Financial resource strain: Not on file  . Food insecurity    Worry: Not on file    Inability: Not on file  . Transportation needs    Medical: Not on file    Non-medical: Not on file  Tobacco Use  . Smoking status: Never Smoker  . Smokeless tobacco: Never Used  Substance and Sexual Activity  . Alcohol use: Not Currently  . Drug use: Not Currently  . Sexual activity: Not on file  Lifestyle  . Physical activity    Days per week: Not on file    Minutes per session: Not on file  . Stress: Not on file  Relationships  . Social connections  Talks on phone: Not on file    Gets together: Not on file    Attends religious service: Not on file    Active member of club or organization: Not on file    Attends meetings of clubs or organizations: Not on file    Relationship status: Not on file  . Intimate partner violence    Fear of current or ex partner: Not on file    Emotionally abused: Not on file    Physically abused: Not on file    Forced sexual activity: Not on file  Other Topics Concern  . Not on file  Social History Narrative  . Not on file    Outpatient Medications Prior to Visit  Medication Sig Dispense Refill  . alendronate (FOSAMAX) 70 MG tablet Take 70 mg by mouth once a week. Tuesday  1  . amLODipine (NORVASC) 5 MG tablet Take 5 mg by mouth daily.   1  . apixaban (ELIQUIS) 2.5 MG TABS tablet Take 2.5 mg by mouth 2 (two) times daily.     Marland Kitchen aspirin 81 MG chewable tablet Chew 81 mg by mouth daily.    Marland Kitchen atorvastatin (LIPITOR) 20 MG tablet Take 20 mg by mouth daily.  2  . Cholecalciferol (VITAMIN D) 2000 units CAPS Take 5,000 Units by mouth daily.     Marland Kitchen lisinopril (PRINIVIL,ZESTRIL) 5 MG tablet Take 5 mg by mouth daily.  2  . montelukast (SINGULAIR) 10 MG tablet      No facility-administered medications prior to visit.     No Known Allergies  ROS Review of Systems  Constitutional: Positive  for fatigue (mild). Negative for fever.  HENT: Negative for sore throat and trouble swallowing.        No issues with swallowing per patient/daughter but reminded daughter that patient needs to sit upright for at least an hour after taking Fosamax and also needs to drink at least 8 ounces of water when taking the medication.  Eyes: Negative for photophobia and visual disturbance.  Respiratory: Negative for cough and shortness of breath.   Cardiovascular: Positive for leg swelling (occasional). Negative for chest pain and palpitations.  Gastrointestinal: Negative for abdominal pain, blood in stool, constipation, diarrhea, nausea and vomiting.       No melena  Endocrine: Negative for polydipsia, polyphagia and polyuria.  Genitourinary: Negative for hematuria.  Musculoskeletal: Positive for arthralgias (knees) and gait problem.  Skin: Negative for rash and wound.  Neurological: Negative for dizziness and headaches.  Hematological: Negative for adenopathy. Does not bruise/bleed easily.      Objective:    Physical Exam  Constitutional: She is oriented to person, place, and time. She appears well-developed and well-nourished.  Neck: Normal range of motion. Neck supple. No JVD present.  Patient with very mild bilateral posterior cervical/upper back spasm and no significant tenderness or pain with palpation of the area  Cardiovascular: Normal rate and regular rhythm.  Appears to be in regular rhythm with occasional ectopics on exam  Pulmonary/Chest: Effort normal and breath sounds normal.  Abdominal: Soft. There is no abdominal tenderness. There is no rebound and no guarding.  Musculoskeletal:        General: Tenderness (bilateral knee joint line tenderness and decreased knee ROM) and deformity (mild deformity of fingers and heberden nodes at the PIP joints) present. No edema.     Comments: Patient with slow, slightly unstable gait with small steps; requires assistance stepping onto pullout step  in order to get onto exam table  Lymphadenopathy:    She has no cervical adenopathy.  Neurological: She is alert and oriented to person, place, and time.  Skin: Skin is warm and dry.  Psychiatric: She has a normal mood and affect. Her behavior is normal.  Nursing note and vitals reviewed.   BP 121/63   Pulse 60   Temp 98.5 F (36.9 C) (Oral)   Ht 5' 0.5" (1.537 m)   Wt 125 lb 12.8 oz (57.1 kg)   SpO2 99%   BMI 24.16 kg/m  Wt Readings from Last 3 Encounters:  11/21/18 125 lb 12.8 oz (57.1 kg)  10/16/18 124 lb 9.6 oz (56.5 kg)  10/07/18 117 lb 1.6 oz (53.1 kg)     Health Maintenance Due  Topic Date Due  . DEXA SCAN  11/02/1997  . INFLUENZA VACCINE  10/12/2018   Daughter that is with the patient reports that she is not patient's medical POA and will notify her sister that patient needs influenza immunization as this is normally done by her sister (Patient has six daughters)   No results found for: TSH Lab Results  Component Value Date   WBC 8.3 10/07/2018   HGB 11.0 (L) 10/07/2018   HCT 33.2 (L) 10/07/2018   MCV 94.6 10/07/2018   PLT 183 10/07/2018   Lab Results  Component Value Date   NA 140 10/07/2018   K 3.9 10/07/2018   CO2 26 10/07/2018   GLUCOSE 102 (H) 10/07/2018   BUN 20 10/07/2018   CREATININE 0.81 10/07/2018   BILITOT 0.9 10/05/2018   ALKPHOS 70 10/05/2018   AST 17 10/05/2018   ALT 16 10/05/2018   PROT 8.0 10/05/2018   ALBUMIN 3.7 10/05/2018   CALCIUM 8.9 10/07/2018   ANIONGAP 9 10/07/2018   No results found for: CHOL No results found for: HDL No results found for: LDLCALC No results found for: TRIG No results found for: CHOLHDL No results found for: [id number]    Assessment & Plan:  1. Recurrent UTI (urinary tract infection) Patient is status post recent hospitalization for pyelonephritis due to urinary tract infection.  Discussed signs and symptoms of urinary tract infection which may be very subtle in older adults.  Discussed that if patient  with loss of appetite, fatigue/malaise or increased back pain that these could also be signs of urinary tract infection in addition to more classic signs such as burning or pain with urination, frequent urination, urinary frequency , lower abdominal pain/discomfort or new onset issues with urinary incontinence.  Patient will return for UA/urine culture later this week or next week in follow-up of urinary tract infection/hospitalization for pyelonephritis and per her daughter, patient [facility] [place] later this month. - Urine Culture; Future  2. Chronic atrial fibrillation Discussed with patient, daughter why anticoagulation is important as atrial fibrillation increases the risk of embolic stroke. She appeared to be in normal sinus rhythm today with normal rate and denied any issues with abnormal bleeding related to use of anticoagulant medication.  3. Essential hypertension; 5. Recurrent dry cough Blood pressure is well controlled on her current medication regimen of amlodipine and lisinopril however patient also with chronic dry cough on ROS and daughter was told to discuss with patient's PCP in [place] whom patient will also continue to see, whether cough may be related to use of lisinopril or if patient has otherwise had CXR/cough work-up.   4. Long term current use of anticoagulant; mild anemia Patient reports no current signs or symptoms  of bleeding related to her use of Eliquis. Discussed with daughter the importance of patient having prompt medical attention if she experiences any signs or symptoms of acute bleeding or if patient has any falls or if patient hits her head such as when getting in or out of the car and hitting her head against the door frame.  If patient experiences any falls especially with suspected head injury or any blows to the head then patient will need medical evaluation and imaging due to an increased rate of brain/cerebral  bleeding/hematoma.  Patient with mild anemia on recent CBC done during hospitalization on 10/07/2018 with hemoglobin of 11.0 and MCV of 94.6.  Patient will have follow-up with her former primary care provider in the upcoming months and will likely need repeat CBC at that time, sooner if any signs/symptoms of bleeding or increased fatigue.  6. Primary osteoarthritis of both knees; 8. OA of hands; 9. Impairment of gait and mobility Offered referral to Orthopedics regarding knee injections as patient reports no interest in surgical intervention for her knee pain and impaired gait and mobility. Patient did not have her walker with her today but use is encouraged to help reduce fall risk.  Discussed the use of now over-the-counter Voltaren/diclofenac gel to use topically to the knees and hands to help with arthritis pain.  Patient should avoid the use of nonsteroidal anti-inflammatories as she is currently on Eliquis as well as a daily baby aspirin for blood thinning.  CBC done during hospitalization on 10/07/2018 showed mild anemia with a hemoglobin of 11.0 with normal MCV of 94.6.  7. History of acute pyelonephritis Patient is s/p hospitalization from 10/05/2018-10/07/2018 for acute pyelonephritis and was discharged on keflex x 10 days.  Patient initially presented to the emergency department due to syncopal episode and was treated for dehydration but patient with fever when she got home from hospitalization on the same day and daughter brought patient back to the emergency department at which time patient was placed on Keflex for UTI/pyelonephritis.  Daughter reports that patient has follow-up appointment later this month in [place] with Urology and patient will return for lab visit this week for UA/urine culture as lab had already picked up for the day before the end of patient's visit.   -Daughter states that patient is being worked up for dementia by her primary care provider in [place] therefore daughter was  asked to address questions regarding patient's competency for self administration of financial matters with PCP in [place].  An After Visit Summary was printed and given to the patient.  Follow-up: Return for as needed; 4-6 months depending on follow-up in [place].  Daughter expresses that patient will also continue to see her [facility] [place] [facility] [place].  Current plan is for patient to spend time with several of her daughters for undetermined time periods at this point.    Lateya Dauria, MD   More than 30 minutes spent with the patient and her daughter today's visit on follow-up of chronic medical issues and medical decision making.

## 2018-11-28 ENCOUNTER — Other Ambulatory Visit: Payer: Self-pay

## 2018-11-28 ENCOUNTER — Ambulatory Visit: Payer: Medicare Other | Attending: Family Medicine

## 2018-11-28 DIAGNOSIS — N390 Urinary tract infection, site not specified: Secondary | ICD-10-CM

## 2018-12-02 ENCOUNTER — Other Ambulatory Visit: Payer: Self-pay | Admitting: Family Medicine

## 2018-12-02 ENCOUNTER — Telehealth: Payer: Self-pay

## 2018-12-02 DIAGNOSIS — N3000 Acute cystitis without hematuria: Secondary | ICD-10-CM

## 2018-12-02 LAB — URINE CULTURE

## 2018-12-02 MED ORDER — CEPHALEXIN 500 MG PO CAPS: 500.0000 mg | ORAL_CAPSULE | Freq: Two times a day (BID) | ORAL | 0 refills | Status: AC

## 2018-12-02 NOTE — Telephone Encounter (Signed)
New Message   Pt's daughter is calling to check on the status of the pt's labs. Please f/u

## 2018-12-02 NOTE — Progress Notes (Signed)
Patient ID: Sierra Burch, female   DOB: Jul 17, 1930, 85 y.o.   MRN: [id number]   Patient with urinalysis done at recent visit which was sent for culture.  Culture results were resulted today and patient still with urinary tract infection.  Prescription will be sent to [facility] on [address] per daughter's preference for treatment of urinary tract infection.  Patient also has appointment later this month with urology.  Prescription is being sent in for Keflex 500 mg twice daily x7 days for treatment of urinary tract infection and daughter will be contacted by medical assistant to see if daughter would like to have copy of urine culture faxed to mail to daughter or to patient's urologist ahead of her upcoming visit.

## 2018-12-03 NOTE — Telephone Encounter (Signed)
I called and spoke with patient yesterday regarding her mother's labs

## 2018-12-04 NOTE — Telephone Encounter (Signed)
noted 

## 2019-02-24 ENCOUNTER — Other Ambulatory Visit: Payer: Self-pay

## 2019-02-24 ENCOUNTER — Ambulatory Visit: Payer: Medicare Other | Attending: Family Medicine

## 2019-02-24 DIAGNOSIS — N390 Urinary tract infection, site not specified: Secondary | ICD-10-CM

## 2019-02-25 LAB — URINALYSIS
Bilirubin, UA: NEGATIVE
Glucose, UA: NEGATIVE
Ketones, UA: NEGATIVE
Nitrite, UA: NEGATIVE
Specific Gravity, UA: 1.02 (ref 1.005–1.030)
Urobilinogen, Ur: 1 mg/dL (ref 0.2–1.0)
pH, UA: 7 (ref 5.0–7.5)

## 2019-02-26 ENCOUNTER — Other Ambulatory Visit: Payer: Self-pay | Admitting: Family Medicine

## 2019-02-26 DIAGNOSIS — N390 Urinary tract infection, site not specified: Secondary | ICD-10-CM

## 2019-02-26 LAB — URINE CULTURE

## 2019-02-26 MED ORDER — CEPHALEXIN 500 MG PO CAPS: 500.0000 mg | ORAL_CAPSULE | Freq: Two times a day (BID) | ORAL | 0 refills | Status: AC

## 2019-04-02 ENCOUNTER — Emergency Department (HOSPITAL_COMMUNITY)
Admission: EM | Admit: 2019-04-02 | Discharge: 2019-04-02 | Disposition: A | Discharge status: Home / Self Care | Payer: Medicare Other | Attending: Emergency Medicine | Admitting: Emergency Medicine

## 2019-04-02 ENCOUNTER — Emergency Department (HOSPITAL_COMMUNITY): Payer: Medicare Other

## 2019-04-02 DIAGNOSIS — F0390 Unspecified dementia without behavioral disturbance: Secondary | ICD-10-CM | POA: Insufficient documentation

## 2019-04-02 DIAGNOSIS — Z7901 Long term (current) use of anticoagulants: Secondary | ICD-10-CM | POA: Diagnosis not present

## 2019-04-02 DIAGNOSIS — I10 Essential (primary) hypertension: Secondary | ICD-10-CM | POA: Insufficient documentation

## 2019-04-02 DIAGNOSIS — R4781 Slurred speech: Secondary | ICD-10-CM | POA: Insufficient documentation

## 2019-04-02 DIAGNOSIS — Z7982 Long term (current) use of aspirin: Secondary | ICD-10-CM | POA: Insufficient documentation

## 2019-04-02 DIAGNOSIS — Z79899 Other long term (current) drug therapy: Secondary | ICD-10-CM | POA: Diagnosis not present

## 2019-04-02 LAB — COMPREHENSIVE METABOLIC PANEL
ALT: 15 U/L (ref 0–44)
AST: 19 U/L (ref 15–41)
Albumin: 3.4 g/dL — ABNORMAL LOW (ref 3.5–5.0)
Alkaline Phosphatase: 57 U/L (ref 38–126)
Anion gap: 10 (ref 5–15)
BUN: 18 mg/dL (ref 8–23)
CO2: 27 mmol/L (ref 22–32)
Calcium: 9.1 mg/dL (ref 8.9–10.3)
Chloride: 105 mmol/L (ref 98–111)
Creatinine, Ser: 0.89 mg/dL (ref 0.44–1.00)
GFR calc Af Amer: >60 mL/min (ref >60)
GFR calc non Af Amer: 59 mL/min — ABNORMAL LOW (ref >60)
Glucose, Bld: 125 mg/dL — ABNORMAL HIGH (ref 70–99)
Potassium: 3.8 mmol/L (ref 3.5–5.1)
Sodium: 142 mmol/L (ref 135–145)
Total Bilirubin: 0.9 mg/dL (ref 0.3–1.2)
Total Protein: 6.6 g/dL (ref 6.5–8.1)

## 2019-04-02 LAB — URINALYSIS, ROUTINE W REFLEX MICROSCOPIC
Bilirubin Urine: NEGATIVE
Glucose, UA: NEGATIVE mg/dL
Hgb urine dipstick: NEGATIVE
Ketones, ur: NEGATIVE mg/dL
Leukocytes,Ua: NEGATIVE
Nitrite: NEGATIVE
Protein, ur: NEGATIVE mg/dL
Specific Gravity, Urine: 1.012 (ref 1.005–1.030)
pH: 8 (ref 5.0–8.0)

## 2019-04-02 LAB — DIFFERENTIAL
Abs Immature Granulocytes: 0.01 10*3/uL (ref 0.00–0.07)
Basophils Absolute: 0 10*3/uL (ref 0.0–0.1)
Basophils Relative: 1 %
Eosinophils Absolute: 0.1 10*3/uL (ref 0.0–0.5)
Eosinophils Relative: 1 %
Immature Granulocytes: 0 %
Lymphocytes Relative: 27 %
Lymphs Abs: 1.4 10*3/uL (ref 0.7–4.0)
Monocytes Absolute: 0.3 10*3/uL (ref 0.1–1.0)
Monocytes Relative: 6 %
Neutro Abs: 3.4 10*3/uL (ref 1.7–7.7)
Neutrophils Relative %: 65 %

## 2019-04-02 LAB — CBC
HCT: 38.3 % (ref 36.0–46.0)
Hemoglobin: 12.5 g/dL (ref 12.0–15.0)
MCH: 31.9 pg (ref 26.0–34.0)
MCHC: 32.6 g/dL (ref 30.0–36.0)
MCV: 97.7 fL (ref 80.0–100.0)
Platelets: 168 10*3/uL (ref 150–400)
RBC: 3.92 MIL/uL (ref 3.87–5.11)
RDW: 13.7 % (ref 11.5–15.5)
WBC: 5.1 10*3/uL (ref 4.0–10.5)
nRBC: 0 % (ref 0.0–0.2)

## 2019-04-02 LAB — CBG MONITORING, ED: Glucose-Capillary: 141 mg/dL — ABNORMAL HIGH (ref 70–99)

## 2019-04-02 LAB — PROTIME-INR
INR: 1.1 (ref 0.8–1.2)
Prothrombin Time: 13.8 seconds (ref 11.4–15.2)

## 2019-04-02 LAB — APTT: aPTT: 31 seconds (ref 24–36)

## 2019-04-02 NOTE — ED Notes (Signed)
Pt encouraged to provide urine per MD order.

## 2019-04-02 NOTE — ED Notes (Signed)
EDP notified in change of NIH assessment.

## 2019-04-02 NOTE — ED Triage Notes (Addendum)
Pt to ED via POV with family- chief complaint of slurred speech. On assessment pt alert. Oriented to situation, place, self, disoriented to time. Family reports recently dx with dementia. Pt reports she lives with her daughter and daughter was concerned because patient speech was slurred around 9am. Per daughter pt speech has been intermittently slurred. On assessment no slurred speech noted. Daughter also states she thinks pt has a UTI due to odorous urine.

## 2019-04-02 NOTE — ED Notes (Signed)
Pt transported to CT ?

## 2019-04-02 NOTE — ED Notes (Signed)
Pt transported to MRI 

## 2019-04-02 NOTE — ED Notes (Signed)
Pt and family verbalized understanding of DC instructions and follow up care with neurology.

## 2019-04-02 NOTE — ED Provider Notes (Addendum)
[facility] EMERGENCY DEPARTMENT Provider Note   CSN: [id number] Arrival date & time: 04/02/19  1032     History Chief Complaint  Patient presents with  . Aphasia    Sierra Burch is a 86 y.o. female.  86 year old female with past medical history including dementia, atrial fibrillation on anticoagulation, CVA, hypertension, hyperlipidemia who presents with slurred speech.  Daughter states that this morning around 5 AM, she heard the patient say something normally when she first woke up.  Later on this morning, patient got up and she was helping her get dressed when she noticed that her speech seemed slurred.  She got her siblings on the phone to listen to the patient and they agree that her speech did not sound normal.  The patient herself states that she was fumbling with her dentures that were not completely set in.  Daughter states that her speech still does not seem to be quite back to normal.  Patient normally ambulates with a cane and her gait has been at baseline today.  Daughter has not noticed any focal weakness or other symptoms and patient denies any other complaints.  No fevers or recent illness, no urinary symptoms.   LEVEL 5 CAVEAT DUE TO DEMENTIA  The history is provided by the patient and a relative.       Past Medical History:  Diagnosis Date  . A-fib (HCC)   . Acute pyelonephritis 10/06/2018  . Dementia (HCC)   . Hyperlipidemia   . Hypertension   . Stroke (HCC)   . Syncope 10/04/2018    Patient Active Problem List   Diagnosis Date Noted  . Chronic atrial fibrillation (HCC) 10/16/2018  . Hypertension 10/16/2018  . Hypercholesterolemia 10/16/2018  . Dementia (HCC) 10/06/2018  . Fall   . Scalp laceration     Past Surgical History:  Procedure Laterality Date  . NO PAST SURGERIES       OB History   No obstetric history on file.     Family History  Problem Relation Age of Onset  . Stomach cancer Father   . Hypertension Brother     . Stroke Brother     Social History   Tobacco Use  . Smoking status: Never Smoker  . Smokeless tobacco: Never Used  Substance Use Topics  . Alcohol use: Not Currently  . Drug use: Not Currently    Home Medications Prior to Admission medications   Medication Sig Start Date End Date Taking? Authorizing Provider  alendronate (FOSAMAX) 70 MG tablet Take 70 mg by mouth once a week. Tuesday 12/11/17   [provider]  amLODipine (NORVASC) 5 MG tablet Take 5 mg by mouth daily.  12/11/17   [provider]  apixaban (ELIQUIS) 2.5 MG TABS tablet Take 2.5 mg by mouth 2 (two) times daily.     [provider]  aspirin 81 MG chewable tablet Chew 81 mg by mouth daily.    [provider]  atorvastatin (LIPITOR) 20 MG tablet Take 20 mg by mouth daily. 12/27/17   [provider]  Cholecalciferol (VITAMIN D) 2000 units CAPS Take 5,000 Units by mouth daily.     [provider]  lisinopril (PRINIVIL,ZESTRIL) 5 MG tablet Take 5 mg by mouth daily. 12/13/17   [provider]  montelukast (SINGULAIR) 10 MG tablet  11/04/18   [provider]    Allergies    Patient has no known allergies.  Review of Systems   Review of Systems  Unable  to perform ROS: Dementia    Physical Exam Updated Vital Signs BP (!) 131/59   Pulse (!) 59   Temp 97.9 F (36.6 C) (Oral)   Resp 17   Ht 5\' 2"  (1.575 m)   Wt 63.5 kg   SpO2 98%   BMI 25.61 kg/m   Physical Exam Vitals and nursing note reviewed.  Constitutional:      General: She is not in acute distress.    Appearance: She is well-developed.  HENT:     Head: Normocephalic and atraumatic.  Eyes:     Extraocular Movements: Extraocular movements intact.     Conjunctiva/sclera: Conjunctivae normal.     Pupils: Pupils are equal, round, and reactive to light.  Cardiovascular:     Rate and Rhythm: Normal rate and regular rhythm.     Heart sounds: Normal heart sounds. No murmur.  Pulmonary:      Effort: Pulmonary effort is normal.     Breath sounds: Normal breath sounds.  Abdominal:     General: Bowel sounds are normal. There is no distension.     Palpations: Abdomen is soft.     Tenderness: There is no abdominal tenderness.  Musculoskeletal:     Cervical back: Neck supple.  Skin:    General: Skin is warm and dry.  Neurological:     Mental Status: She is alert.     Deep Tendon Reflexes: Reflexes normal.     Comments: Oriented x 2, slight dysarthria during conversation but speech fluent w/ no word finding difficulty or receptive aphasia, no facial asymmetry, 5/5 strength and normal sensation x all 4 ext     ED Results / Procedures / Treatments   Labs (all labs ordered are listed, but only abnormal results are displayed) Labs Reviewed  COMPREHENSIVE METABOLIC PANEL - Abnormal; Notable for the following components:      Result Value   Glucose, Bld 125 (*)    Albumin 3.4 (*)    GFR calc non Af Amer 59 (*)    All other components within normal limits  CBG MONITORING, ED - Abnormal; Notable for the following components:   Glucose-Capillary 141 (*)    All other components within normal limits  PROTIME-INR  APTT  CBC  DIFFERENTIAL  URINALYSIS, ROUTINE W REFLEX MICROSCOPIC    EKG EKG Interpretation  Date/Time:  Wednesday April 02 2019 10:45:53 EST Ventricular Rate:  69 PR Interval:    QRS Duration: 92 QT Interval:  371 QTC Calculation: 398 R Axis:   46 Text Interpretation: Sinus rhythm Ventricular premature complex occasional PVC but otherwise similar to previous Confirmed by [id number] [phone]) on 04/02/2019 11:10:35 AM   Radiology CT HEAD WO CONTRAST  Result Date: 04/02/2019 CLINICAL DATA:  Altered mental status. Episode of slurred speech today. EXAM: CT HEAD WITHOUT CONTRAST TECHNIQUE: Contiguous axial images were obtained from the base of the skull through the vertex without intravenous contrast. COMPARISON:  Head CT 01/05/2018. FINDINGS: Brain: No  evidence of acute infarction, hemorrhage, hydrocephalus, extra-axial collection or mass lesion/mass effect. Atrophy, extensive chronic microvascular ischemic change and scattered small lacunar infarctions are again seen. Vascular: Extensive atherosclerosis is present. Skull: Intact.  No focal lesion. Sinuses/Orbits: Status post cataract surgery.  Otherwise negative Other: None. IMPRESSION: No acute abnormality. Atrophy and extensive chronic microvascular ischemic change. Atherosclerosis. Electronically Signed   By: 01/07/2018 M.D.   On: 04/02/2019 12:03   MR ANGIO HEAD WO CONTRAST  Result Date: 04/02/2019 CLINICAL DATA:  Slurred speech today; neuro  deficit, acute, stroke suspected. EXAM: MRI HEAD WITHOUT CONTRAST MRA HEAD WITHOUT CONTRAST TECHNIQUE: Multiplanar, multiecho pulse sequences of the brain and surrounding structures were obtained without intravenous contrast. Angiographic images of the head were obtained using MRA technique without contrast. COMPARISON:  Head CT 04/02/2019 FINDINGS: MRI HEAD FINDINGS Brain: Intermittently motion degraded examination. There are two punctate acute infarct within the right corona radiata (series 2, image 32) (series 2, image 30). No other acute infarct is identified. Background severe chronic small vessel ischemic disease within the cerebral white matter and to a lesser degree within the pons. Chronic lacunar infarcts within the bilateral corona radiata, right basal ganglia and pons. Chronic ischemic changes also noted within the bilateral thalami. Chronic microhemorrhages associated with a right corona radiata/basal ganglia chronic lacunar infarct. There are numerous additional chronic microhemorrhages within the cerebral hemispheres, thalami and brainstem. Mild generalized parenchymal atrophy. No evidence of intracranial mass. No midline shift or extra-axial fluid collection. Vascular: Report separately Skull and upper cervical spine: No focal marrow lesion  Sinuses/Orbits: Bilateral lens replacements. Mild ethmoid sinus mucosal thickening. No significant mastoid effusion. MRA HEAD FINDINGS The intracranial internal carotid arteries are patent without significant stenosis. The M1 middle cerebral arteries are patent bilaterally without significant stenosis. No M2 proximal branch occlusion or high-grade proximal stenosis is identified. The anterior cerebral arteries are patent without high-grade proximal stenosis. The intracranial vertebral arteries are patent without significant stenosis, as is the basilar artery. The posterior cerebral arteries are patent bilaterally without significant proximal stenosis. High-grade focal stenosis within the P3 right posterior cerebral artery. Posterior communicating arteries are poorly delineated and may be hypoplastic or absent bilaterally. IMPRESSION: MRI brain: 1. Two punctate acute white matter infarcts within the right corona radiata. 2. Advanced chronic small vessel ischemic disease with multiple chronic lacunar infarcts as described. 3. Numerous chronic microhemorrhages within the cerebral hemispheres, basal ganglia and thalami. 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 However, the lobar microhemorrhages are suspicious for cerebral amyloid angiopathy. MRA head: 1. No intracranial large vessel occlusion or proximal high-grade arterial stenosis. 2. High-grade focal stenosis within the P3 right posterior cerebral artery. Electronically Signed   By: Kellie Simmering DO   On: 04/02/2019 15:16   MR Brain Wo Contrast (neuro protocol)  Result Date: 04/02/2019 CLINICAL DATA:  Slurred speech today; neuro deficit, acute, stroke suspected. EXAM: MRI HEAD WITHOUT CONTRAST MRA HEAD WITHOUT CONTRAST TECHNIQUE: Multiplanar, multiecho pulse sequences of the brain and surrounding structures were obtained without intravenous contrast. Angiographic images of the head were obtained using MRA technique without  contrast. COMPARISON:  Head CT 04/02/2019 FINDINGS: MRI HEAD FINDINGS Brain: Intermittently motion degraded examination. There are two punctate acute infarct within the right corona radiata (series 2, image 32) (series 2, image 30). No other acute infarct is identified. Background severe chronic small vessel ischemic disease within the cerebral white matter and to a lesser degree within the pons. Chronic lacunar infarcts within the bilateral corona radiata, right basal ganglia and pons. Chronic ischemic changes also noted within the bilateral thalami. Chronic microhemorrhages associated with a right corona radiata/basal ganglia chronic lacunar infarct. There are numerous additional chronic microhemorrhages within the cerebral hemispheres, thalami and brainstem. Mild generalized parenchymal atrophy. No evidence of intracranial mass. No midline shift or extra-axial fluid collection. Vascular: Report separately Skull and upper cervical spine: No focal marrow lesion Sinuses/Orbits: Bilateral lens replacements. Mild ethmoid sinus mucosal thickening. No significant mastoid effusion. MRA HEAD FINDINGS The intracranial internal carotid arteries are patent without significant stenosis.  The M1 middle cerebral arteries are patent bilaterally without significant stenosis. No M2 proximal branch occlusion or high-grade proximal stenosis is identified. The anterior cerebral arteries are patent without high-grade proximal stenosis. The intracranial vertebral arteries are patent without significant stenosis, as is the basilar artery. The posterior cerebral arteries are patent bilaterally without significant proximal stenosis. High-grade focal stenosis within the P3 right posterior cerebral artery. Posterior communicating arteries are poorly delineated and may be hypoplastic or absent bilaterally. IMPRESSION: MRI brain: 1. Two punctate acute white matter infarcts within the right corona radiata. 2. Advanced chronic small vessel  ischemic disease with multiple chronic lacunar infarcts as described. 3. Numerous chronic microhemorrhages within the cerebral hemispheres, basal ganglia and thalami. 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 However, the lobar microhemorrhages are suspicious for cerebral amyloid angiopathy. MRA head: 1. No intracranial large vessel occlusion or proximal high-grade arterial stenosis. 2. High-grade focal stenosis within the P3 right posterior cerebral artery. Electronically Signed   By: Jackey Loge DO   On: 04/02/2019 15:16    Procedures Procedures (including critical care time)  Medications Ordered in ED Medications - No data to display  ED Course  I have reviewed the triage vital signs and the nursing notes.  Pertinent labs & imaging results that were available during my care of the patient were reviewed by me and considered in my medical decision making (see chart for details).    MDM Rules/Calculators/A&P                      Very slight dysarthria on exam but otherwise no focal neuro deficits. Daughter states no residual deficits from remote stroke. Last seen w/ normal speech at 5am, therefore outside tPA window; held off on calling code stroke.   Screening lab work unremarkable.  Head CT negative acute.  Discussed briefly with neurology.  We have decided to order MRI/MRA to evaluate for stroke or vascular pathology.    MRI shows 2 punctate acute infarcts in right corona radiata. Discussed w/ neurologist Dr. Wilford Corner who reviewed films. Based on current sx, these findings don't require any acute intervention. She is already on maximal medication management for stroke risk reduction. I have recommended that they f/u with outpatient neurologist and continue all current home meds. I have reviewed return precautions and daughter voiced understanding.   Final Clinical Impression(s) / ED Diagnoses Final diagnoses:  Slurred speech    Rx / DC Orders ED Discharge  Orders    None       Maximo Spratling, Ambrose Finland, MD 04/02/19 1431    Sakoya Win, Ambrose [place], MD 04/02/19 1538

## 2019-04-03 ENCOUNTER — Other Ambulatory Visit: Payer: Self-pay

## 2019-04-03 ENCOUNTER — Emergency Department (HOSPITAL_COMMUNITY)
Admission: EM | Admit: 2019-04-03 | Discharge: 2019-04-03 | Disposition: A | Discharge status: Home / Self Care | Payer: Medicare Other | Attending: Emergency Medicine | Admitting: Emergency Medicine

## 2019-04-03 ENCOUNTER — Encounter (HOSPITAL_COMMUNITY): Payer: Self-pay | Admitting: Emergency Medicine

## 2019-04-03 ENCOUNTER — Emergency Department (HOSPITAL_COMMUNITY)
Admission: EM | Admit: 2019-04-03 | Discharge: 2019-04-04 | Disposition: A | Discharge status: Home / Self Care | Payer: Medicare Other | Source: Home / Self Care | Attending: Emergency Medicine | Admitting: Emergency Medicine

## 2019-04-03 DIAGNOSIS — I10 Essential (primary) hypertension: Secondary | ICD-10-CM | POA: Insufficient documentation

## 2019-04-03 DIAGNOSIS — F0390 Unspecified dementia without behavioral disturbance: Secondary | ICD-10-CM | POA: Insufficient documentation

## 2019-04-03 DIAGNOSIS — E785 Hyperlipidemia, unspecified: Secondary | ICD-10-CM | POA: Diagnosis not present

## 2019-04-03 DIAGNOSIS — R350 Frequency of micturition: Secondary | ICD-10-CM | POA: Diagnosis not present

## 2019-04-03 DIAGNOSIS — I4891 Unspecified atrial fibrillation: Secondary | ICD-10-CM | POA: Insufficient documentation

## 2019-04-03 DIAGNOSIS — R4781 Slurred speech: Secondary | ICD-10-CM | POA: Diagnosis present

## 2019-04-03 DIAGNOSIS — Z7901 Long term (current) use of anticoagulants: Secondary | ICD-10-CM | POA: Insufficient documentation

## 2019-04-03 DIAGNOSIS — R1310 Dysphagia, unspecified: Secondary | ICD-10-CM | POA: Insufficient documentation

## 2019-04-03 DIAGNOSIS — I639 Cerebral infarction, unspecified: Secondary | ICD-10-CM | POA: Insufficient documentation

## 2019-04-03 DIAGNOSIS — Z7982 Long term (current) use of aspirin: Secondary | ICD-10-CM | POA: Insufficient documentation

## 2019-04-03 DIAGNOSIS — R4701 Aphasia: Secondary | ICD-10-CM | POA: Insufficient documentation

## 2019-04-03 DIAGNOSIS — Z8673 Personal history of transient ischemic attack (TIA), and cerebral infarction without residual deficits: Secondary | ICD-10-CM | POA: Insufficient documentation

## 2019-04-03 DIAGNOSIS — Z79899 Other long term (current) drug therapy: Secondary | ICD-10-CM | POA: Insufficient documentation

## 2019-04-03 LAB — COMPREHENSIVE METABOLIC PANEL
ALT: 15 U/L (ref 0–44)
AST: 19 U/L (ref 15–41)
Albumin: 3.6 g/dL (ref 3.5–5.0)
Alkaline Phosphatase: 59 U/L (ref 38–126)
Anion gap: 9 (ref 5–15)
BUN: 14 mg/dL (ref 8–23)
CO2: 29 mmol/L (ref 22–32)
Calcium: 9.4 mg/dL (ref 8.9–10.3)
Chloride: 102 mmol/L (ref 98–111)
Creatinine, Ser: 0.84 mg/dL (ref 0.44–1.00)
GFR calc Af Amer: >60 mL/min (ref >60)
GFR calc non Af Amer: >60 mL/min (ref >60)
Glucose, Bld: 140 mg/dL — ABNORMAL HIGH (ref 70–99)
Potassium: 4 mmol/L (ref 3.5–5.1)
Sodium: 140 mmol/L (ref 135–145)
Total Bilirubin: 0.6 mg/dL (ref 0.3–1.2)
Total Protein: 6.9 g/dL (ref 6.5–8.1)

## 2019-04-03 LAB — DIFFERENTIAL
Abs Immature Granulocytes: 0.01 10*3/uL (ref 0.00–0.07)
Basophils Absolute: 0 10*3/uL (ref 0.0–0.1)
Basophils Relative: 1 %
Eosinophils Absolute: 0.1 10*3/uL (ref 0.0–0.5)
Eosinophils Relative: 1 %
Immature Granulocytes: 0 %
Lymphocytes Relative: 25 %
Lymphs Abs: 1.3 10*3/uL (ref 0.7–4.0)
Monocytes Absolute: 0.3 10*3/uL (ref 0.1–1.0)
Monocytes Relative: 5 %
Neutro Abs: 3.7 10*3/uL (ref 1.7–7.7)
Neutrophils Relative %: 68 %

## 2019-04-03 LAB — CBC
HCT: 39.4 % (ref 36.0–46.0)
Hemoglobin: 12.6 g/dL (ref 12.0–15.0)
MCH: 31.3 pg (ref 26.0–34.0)
MCHC: 32 g/dL (ref 30.0–36.0)
MCV: 97.8 fL (ref 80.0–100.0)
Platelets: 181 10*3/uL (ref 150–400)
RBC: 4.03 MIL/uL (ref 3.87–5.11)
RDW: 13.5 % (ref 11.5–15.5)
WBC: 5.4 10*3/uL (ref 4.0–10.5)
nRBC: 0 % (ref 0.0–0.2)

## 2019-04-03 LAB — PROTIME-INR
INR: 1.1 (ref 0.8–1.2)
Prothrombin Time: 14.4 seconds (ref 11.4–15.2)

## 2019-04-03 LAB — I-STAT CHEM 8, ED
BUN: 15 mg/dL (ref 8–23)
Calcium, Ion: 1.2 mmol/L (ref 1.15–1.40)
Chloride: 101 mmol/L (ref 98–111)
Creatinine, Ser: 0.8 mg/dL (ref 0.44–1.00)
Glucose, Bld: 140 mg/dL — ABNORMAL HIGH (ref 70–99)
HCT: 37 % (ref 36.0–46.0)
Hemoglobin: 12.6 g/dL (ref 12.0–15.0)
Potassium: 3.9 mmol/L (ref 3.5–5.1)
Sodium: 141 mmol/L (ref 135–145)
TCO2: 30 mmol/L (ref 22–32)

## 2019-04-03 LAB — APTT: aPTT: 33 seconds (ref 24–36)

## 2019-04-03 LAB — CBG MONITORING, ED: Glucose-Capillary: 116 mg/dL — ABNORMAL HIGH (ref 70–99)

## 2019-04-03 MED ORDER — SODIUM CHLORIDE 0.9% FLUSH: 3.0000 mL | Freq: Once | INTRAVENOUS | Status: DC

## 2019-04-03 NOTE — ED Triage Notes (Signed)
Pt arrives with family with reports of slurred speech. Pt was seen here yesterday and had MRI that showed 2 strokes. Family states the slurred speech worsened around 6 last night and the pt has been drooling. Reports she was using the bathroom very frequently last night.

## 2019-04-03 NOTE — ED Provider Notes (Signed)
[facility] EMERGENCY DEPARTMENT Provider Note   CSN: [id number] Arrival date & time: 04/03/19  1105     History Chief Complaint  Patient presents with  . Aphasia    Sierra Burch is a 86 y.o. female.  HPI 86 year old female presents with worsening slurred speech.  Started yesterday overall and she came here and got an MRI.  The MRI showed punctate infarcts, unclear if they were correlating to her symptoms.  Since then daughter feels like the slurred speech is worse and there is a question of whether or not she was swallowing okay.  No new weakness.  Patient has also been going to the bathroom a lot for urination but no dysuria.   Past Medical History:  Diagnosis Date  . A-fib (HCC)   . Acute pyelonephritis 10/06/2018  . Dementia (HCC)   . Hyperlipidemia   . Hypertension   . Stroke (HCC)   . Syncope 10/04/2018    Patient Active Problem List   Diagnosis Date Noted  . Chronic atrial fibrillation (HCC) 10/16/2018  . Hypertension 10/16/2018  . Hypercholesterolemia 10/16/2018  . Dementia (HCC) 10/06/2018  . Fall   . Scalp laceration     Past Surgical History:  Procedure Laterality Date  . NO PAST SURGERIES       OB History   No obstetric history on file.     Family History  Problem Relation Age of Onset  . Stomach cancer Father   . Hypertension Brother   . Stroke Brother     Social History   Tobacco Use  . Smoking status: Never Smoker  . Smokeless tobacco: Never Used  Substance Use Topics  . Alcohol use: Not Currently  . Drug use: Not Currently    Home Medications Prior to Admission medications   Medication Sig Start Date End Date Taking? Authorizing Provider  alendronate (FOSAMAX) 70 MG tablet Take 70 mg by mouth once a week. Tuesday 12/11/17   [provider]  amLODipine (NORVASC) 5 MG tablet Take 5 mg by mouth daily.  12/11/17   [provider]  apixaban (ELIQUIS) 2.5 MG TABS tablet Take 2.5 mg by mouth 2 (two)  times daily.     [provider]  aspirin 81 MG chewable tablet Chew 81 mg by mouth daily.    [provider]  atorvastatin (LIPITOR) 20 MG tablet Take 20 mg by mouth daily. 12/27/17   [provider]  Cholecalciferol (VITAMIN D) 2000 units CAPS Take 5,000 Units by mouth daily.     [provider]  lisinopril (PRINIVIL,ZESTRIL) 5 MG tablet Take 5 mg by mouth daily. 12/13/17   [provider]  montelukast (SINGULAIR) 10 MG tablet  11/04/18   [provider]    Allergies    Patient has no known allergies.  Review of Systems   Review of Systems  Constitutional: Negative for fever.  Gastrointestinal: Negative for vomiting.  Genitourinary: Positive for frequency. Negative for dysuria.  Neurological: Positive for speech difficulty. Negative for headaches.  All other systems reviewed and are negative.   Physical Exam Updated Vital Signs BP 140/69   Pulse (!) 55   Temp 98.2 F (36.8 C) (Oral)   Resp 19   Ht 5\' 2"  (1.575 m)   Wt 63.5 kg   SpO2 98%   BMI 25.61 kg/m   Physical Exam Vitals and nursing note reviewed.  Constitutional:      Appearance: She is well-developed.  HENT:     Head: Normocephalic  and atraumatic.     Right Ear: External ear normal.     Left Ear: External ear normal.     Nose: Nose normal.  Eyes:     General:        Right eye: No discharge.        Left eye: No discharge.  Cardiovascular:     Rate and Rhythm: Normal rate and regular rhythm.     Heart sounds: Normal heart sounds.  Pulmonary:     Effort: Pulmonary effort is normal.     Breath sounds: Normal breath sounds.  Abdominal:     Palpations: Abdomen is soft.     Tenderness: There is no abdominal tenderness.  Skin:    General: Skin is warm and dry.  Neurological:     Mental Status: She is alert.     Comments: CN 3-12 grossly intact. Has slurred speech but no obvious facial droop.  5/5 strength in all 4 extremities. Grossly normal sensation.  Normal finger to nose.   Psychiatric:        Mood and Affect: Mood is not anxious.     ED Results / Procedures / Treatments   Labs (all labs ordered are listed, but only abnormal results are displayed) Labs Reviewed  COMPREHENSIVE METABOLIC PANEL - Abnormal; Notable for the following components:      Result Value   Glucose, Bld 140 (*)    All other components within normal limits  I-STAT CHEM 8, ED - Abnormal; Notable for the following components:   Glucose, Bld 140 (*)    All other components within normal limits  CBG MONITORING, ED - Abnormal; Notable for the following components:   Glucose-Capillary 116 (*)    All other components within normal limits  URINE CULTURE  PROTIME-INR  APTT  CBC  DIFFERENTIAL  URINALYSIS, ROUTINE W REFLEX MICROSCOPIC    EKG EKG Interpretation  Date/Time:  Thursday April 03 2019 11:16:10 EST Ventricular Rate:  94 PR Interval:  148 QRS Duration: 80 QT Interval:  340 QTC Calculation: 425 R Axis:   69 Text Interpretation: Sinus rhythm with occasional Premature ventricular complexes rate is faster compared to yesterday Confirmed by Pricilla Loveless [phone]) on 04/03/2019 11:47:23 AM   Radiology CT HEAD WO CONTRAST  Result Date: 04/02/2019 CLINICAL DATA:  Altered mental status. Episode of slurred speech today. EXAM: CT HEAD WITHOUT CONTRAST TECHNIQUE: Contiguous axial images were obtained from the base of the skull through the vertex without intravenous contrast. COMPARISON:  Head CT 01/05/2018. FINDINGS: Brain: No evidence of acute infarction, hemorrhage, hydrocephalus, extra-axial collection or mass lesion/mass effect. Atrophy, extensive chronic microvascular ischemic change and scattered small lacunar infarctions are again seen. Vascular: Extensive atherosclerosis is present. Skull: Intact.  No focal lesion. Sinuses/Orbits: Status post cataract surgery.  Otherwise negative Other: None. IMPRESSION: No acute abnormality. Atrophy and extensive  chronic microvascular ischemic change. Atherosclerosis. Electronically Signed   By: Drusilla Kanner M.D.   On: 04/02/2019 12:03   MR ANGIO HEAD WO CONTRAST  Result Date: 04/02/2019 CLINICAL DATA:  Slurred speech today; neuro deficit, acute, stroke suspected. EXAM: MRI HEAD WITHOUT CONTRAST MRA HEAD WITHOUT CONTRAST TECHNIQUE: Multiplanar, multiecho pulse sequences of the brain and surrounding structures were obtained without intravenous contrast. Angiographic images of the head were obtained using MRA technique without contrast. COMPARISON:  Head CT 04/02/2019 FINDINGS: MRI HEAD FINDINGS Brain: Intermittently motion degraded examination. There are two punctate acute infarct within the right corona radiata (series 2, image 32) (series 2, image 30). No  other acute infarct is identified. Background severe chronic small vessel ischemic disease within the cerebral white matter and to a lesser degree within the pons. Chronic lacunar infarcts within the bilateral corona radiata, right basal ganglia and pons. Chronic ischemic changes also noted within the bilateral thalami. Chronic microhemorrhages associated with a right corona radiata/basal ganglia chronic lacunar infarct. There are numerous additional chronic microhemorrhages within the cerebral hemispheres, thalami and brainstem. Mild generalized parenchymal atrophy. No evidence of intracranial mass. No midline shift or extra-axial fluid collection. Vascular: Report separately Skull and upper cervical spine: No focal marrow lesion Sinuses/Orbits: Bilateral lens replacements. Mild ethmoid sinus mucosal thickening. No significant mastoid effusion. MRA HEAD FINDINGS The intracranial internal carotid arteries are patent without significant stenosis. The M1 middle cerebral arteries are patent bilaterally without significant stenosis. No M2 proximal branch occlusion or high-grade proximal stenosis is identified. The anterior cerebral arteries are patent without  high-grade proximal stenosis. The intracranial vertebral arteries are patent without significant stenosis, as is the basilar artery. The posterior cerebral arteries are patent bilaterally without significant proximal stenosis. High-grade focal stenosis within the P3 right posterior cerebral artery. Posterior communicating arteries are poorly delineated and may be hypoplastic or absent bilaterally. IMPRESSION: MRI brain: 1. Two punctate acute white matter infarcts within the right corona radiata. 2. Advanced chronic small vessel ischemic disease with multiple chronic lacunar infarcts as described. 3. Numerous chronic microhemorrhages within the cerebral hemispheres, basal ganglia and thalami. 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 However, the lobar microhemorrhages are suspicious for cerebral amyloid angiopathy. MRA head: 1. No intracranial large vessel occlusion or proximal high-grade arterial stenosis. 2. High-grade focal stenosis within the P3 right posterior cerebral artery. Electronically Signed   By: Kellie Simmering DO   On: 04/02/2019 15:16   MR Brain Wo Contrast (neuro protocol)  Result Date: 04/02/2019 CLINICAL DATA:  Slurred speech today; neuro deficit, acute, stroke suspected. EXAM: MRI HEAD WITHOUT CONTRAST MRA HEAD WITHOUT CONTRAST TECHNIQUE: Multiplanar, multiecho pulse sequences of the brain and surrounding structures were obtained without intravenous contrast. Angiographic images of the head were obtained using MRA technique without contrast. COMPARISON:  Head CT 04/02/2019 FINDINGS: MRI HEAD FINDINGS Brain: Intermittently motion degraded examination. There are two punctate acute infarct within the right corona radiata (series 2, image 32) (series 2, image 30). No other acute infarct is identified. Background severe chronic small vessel ischemic disease within the cerebral white matter and to a lesser degree within the pons. Chronic lacunar infarcts within the  bilateral corona radiata, right basal ganglia and pons. Chronic ischemic changes also noted within the bilateral thalami. Chronic microhemorrhages associated with a right corona radiata/basal ganglia chronic lacunar infarct. There are numerous additional chronic microhemorrhages within the cerebral hemispheres, thalami and brainstem. Mild generalized parenchymal atrophy. No evidence of intracranial mass. No midline shift or extra-axial fluid collection. Vascular: Report separately Skull and upper cervical spine: No focal marrow lesion Sinuses/Orbits: Bilateral lens replacements. Mild ethmoid sinus mucosal thickening. No significant mastoid effusion. MRA HEAD FINDINGS The intracranial internal carotid arteries are patent without significant stenosis. The M1 middle cerebral arteries are patent bilaterally without significant stenosis. No M2 proximal branch occlusion or high-grade proximal stenosis is identified. The anterior cerebral arteries are patent without high-grade proximal stenosis. The intracranial vertebral arteries are patent without significant stenosis, as is the basilar artery. The posterior cerebral arteries are patent bilaterally without significant proximal stenosis. High-grade focal stenosis within the P3 right posterior cerebral artery. Posterior communicating arteries are poorly delineated and may  be hypoplastic or absent bilaterally. IMPRESSION: MRI brain: 1. Two punctate acute white matter infarcts within the right corona radiata. 2. Advanced chronic small vessel ischemic disease with multiple chronic lacunar infarcts as described. 3. Numerous chronic microhemorrhages within the cerebral hemispheres, basal ganglia and thalami. 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 However, the lobar microhemorrhages are suspicious for cerebral amyloid angiopathy. MRA head: 1. No intracranial large vessel occlusion or proximal high-grade arterial stenosis. 2. High-grade focal  stenosis within the P3 right posterior cerebral artery. Electronically Signed   By: Jackey Loge DO   On: 04/02/2019 15:16    Procedures Procedures (including critical care time)  Medications Ordered in ED Medications  sodium chloride flush (NS) 0.9 % injection 3 mL (has no administration in time range)    ED Course  I have reviewed the triage vital signs and the nursing notes.  Pertinent labs & imaging results that were available during my care of the patient were reviewed by me and considered in my medical decision making (see chart for details).    MDM Rules/Calculators/A&P                      Patient passed her swallow screen.  No focal deficits on exam.  Patient was discussed with Dr. Amada Jupiter of neurology.  Given she can swallow without difficulty on nurse swallow screen, does not really need admission.  Perhaps her MRI findings were causing her symptoms but would not benefit from coming into the hospital as she is on maximal therapy.  Follow-up with neurology and PCP.  Discussed with daughter who is okay with this.  No urinary retention.  Unclear why she is having increased urinary frequency but UA was negative from yesterday.  Will send urine for culture. Final Clinical Impression(s) / ED Diagnoses Final diagnoses:  Acute ischemic stroke [facility])    Rx / DC Orders ED Discharge Orders         Ordered    SLP eval and treat     04/03/19 1418           Pricilla Loveless, MD 04/03/19 1550

## 2019-04-03 NOTE — ED Triage Notes (Signed)
arrives wither daughter for worsening of dysphagia- per daughter they were just here for aphasia and dysphagia. Pt was diagnosed with a stroke. Daughter bought her back to ER to be re evaluated and thinks pt needs tubing tube due to her mom just pocketing her food and not swallowing anything for her at home. Pt has hx of dementia as well.

## 2019-04-04 ENCOUNTER — Emergency Department (HOSPITAL_COMMUNITY): Payer: Medicare Other

## 2019-04-04 DIAGNOSIS — I639 Cerebral infarction, unspecified: Secondary | ICD-10-CM | POA: Diagnosis not present

## 2019-04-04 LAB — CBG MONITORING, ED: Glucose-Capillary: 78 mg/dL (ref 70–99)

## 2019-04-04 MED ORDER — SODIUM CHLORIDE 0.9 % IV BOLUS (SEPSIS)
500.0000 mL | Freq: Once | INTRAVENOUS | Status: AC
  Administered 2019-04-04: 500 mL via INTRAVENOUS

## 2019-04-04 NOTE — ED Provider Notes (Signed)
TIME SEEN: 1:23 AM  CHIEF COMPLAINT: Difficulty swallowing  HPI: Patient is an 86 year old female with history of hypertension, hyperlipidemia, A. fib on Eliquis, dementia who presents to the emergency department with her daughter for concerns for difficulty swallowing.  This is her third visit to the emergency department in the past 48 hours.  Was seen 2 days ago for slurred speech and had an MRI that showed 2 small punctate strokes.  Neurology was consulted and recommended outpatient management.  Came back yesterday for worsening slurred speech.  Work-up at that time was unremarkable including normal labs, urine.  Daughter states tonight while eating dinner she noticed that the patient was "stuffing her mouth" with food.  She states then the patient sneezed and food went everywhere.  She states afterwards she was trying to give her liquids and the patient was drooling.  She states she was concerned that the patient was not able to swallow and brought her back to the emergency department.  ROS: Level 5 caveat secondary to dementia  PAST MEDICAL HISTORY/PAST SURGICAL HISTORY:  Past Medical History:  Diagnosis Date  . A-fib (HCC)   . Acute pyelonephritis 10/06/2018  . Dementia (HCC)   . Hyperlipidemia   . Hypertension   . Stroke (HCC)   . Syncope 10/04/2018    MEDICATIONS:  Prior to Admission medications   Medication Sig Start Date End Date Taking? Authorizing Provider  alendronate (FOSAMAX) 70 MG tablet Take 70 mg by mouth once a week. Tuesday 12/11/17   [provider]  amLODipine (NORVASC) 5 MG tablet Take 5 mg by mouth daily.  12/11/17   [provider]  apixaban (ELIQUIS) 2.5 MG TABS tablet Take 2.5 mg by mouth 2 (two) times daily.     [provider]  aspirin 81 MG chewable tablet Chew 81 mg by mouth daily.    [provider]  atorvastatin (LIPITOR) 20 MG tablet Take 20 mg by mouth daily. 12/27/17   [provider]  Cholecalciferol (VITAMIN D)  2000 units CAPS Take 5,000 Units by mouth daily.     [provider]  lisinopril (PRINIVIL,ZESTRIL) 5 MG tablet Take 5 mg by mouth daily. 12/13/17   [provider]  montelukast (SINGULAIR) 10 MG tablet  11/04/18   [provider]    ALLERGIES:  No Known Allergies  SOCIAL HISTORY:  Social History   Tobacco Use  . Smoking status: Never Smoker  . Smokeless tobacco: Never Used  Substance Use Topics  . Alcohol use: Not Currently    FAMILY HISTORY: Family History  Problem Relation Age of Onset  . Stomach cancer Father   . Hypertension Brother   . Stroke Brother     EXAM: BP 139/68   Pulse 62   Temp 98.2 F (36.8 C) (Oral)   Resp 15   SpO2 100%  CONSTITUTIONAL: Alert and oriented and responds appropriately to questions. Well-appearing; well-nourished, elderly, in no distress HEAD: Normocephalic EYES: Conjunctivae clear, pupils appear equal, EOM appear intact ENT: normal nose; moist mucous membranes; No pharyngeal erythema or petechiae, no tonsillar hypertrophy or exudate, no uvular deviation, no unilateral swelling, no trismus or drooling, no muffled voice, normal phonation, no stridor, no dental caries present, no drainable dental abscess noted, no Ludwig's angina, tongue sits flat in the bottom of the mouth, no angioedema, no facial erythema or warmth, no facial swelling; no pain with movement of the neck, no cervical LAD. NECK: Supple, normal ROM CARD: RRR; S1 and S2 appreciated; no murmurs,  no clicks, no rubs, no gallops RESP: Normal chest excursion without splinting or tachypnea; breath sounds clear and equal bilaterally; no wheezes, no rhonchi, no rales, no hypoxia or respiratory distress, speaking full sentences ABD/GI: Normal bowel sounds; non-distended; soft, non-tender, no rebound, no guarding, no peritoneal signs, no hepatosplenomegaly BACK:  The back appears normal EXT: Normal ROM in all joints; no deformity noted, no edema; no cyanosis SKIN:  Normal color for age and race; warm; no rash on exposed skin NEURO: Moves all extremities equally, slightly slurred speech, no facial droop PSYCH: The patient's mood and manner are appropriate.   MEDICAL DECISION MAKING: Patient here with concerns for difficulty swallowing.  Comes in with her daughter who lives with her and cares for her at home.  Daughter reports patient diagnosed with dementia in October.  Diagnosed with 2 punctate strokes 2 days ago on MRI.  Recommended outpatient neurology follow-up at that time she was on maximum medical therapy.  Daughter states tonight she had an episode where she was stuffing her face with food and then afterwards seemed to have difficulty swallowing liquids and was drooling.  No new facial droop.  Slurred speech seems to be at its baseline.  She has passed 3 swallow screens in the past 48 hours including currently.  Not drooling at this time.  Clinically does not appear dehydrated.  Labs and urine were reassuring over the past 48 hours and I do not feel need to be repeated today.  Daughter does report however that she has noticed that the patient will cough at night and also after eating.  I do feel it is very reasonable to obtain a chest x-ray to rule out aspiration.  Will check CBG and give gentle IV hydration and encourage oral fluids here.  I do not think patient has had a new stroke or requires repeat head imaging at this time.  Discussed with patient's daughter that this is likely secondary to her dementia and can be a progressive process.  Daughter seems very well-informed and verbalizes understanding.  Discussed with daughter that I would be happy to help set up home health resources which daughter is very grateful for.  They do have a PCP for close follow-up.  ED PROGRESS: Patient's blood glucose is normal.  Chest x-ray shows no signs of aspiration.  She has been able to drink some here.  Recommended liquid, pured diet at this time and close follow-up with  their PCP.  I have placed orders for home health services.  Daughter is very pleased with this plan.  We did discuss that this could continue to worsen over time and that she also may need increased services such as placement into a memory care unit and that this should be an ongoing conversation with the PCP.  Daughter verbalized understanding.  Will discharge home.   At this time, I do not feel there is any life-threatening condition present. I have reviewed, interpreted and discussed all results (EKG, imaging, lab, urine as appropriate) and exam findings with patient/family. I have reviewed nursing notes and appropriate previous records.  I feel the patient is safe to be discharged home without further emergent workup and can continue workup as an outpatient as needed. Discussed usual and customary return precautions. Patient/family verbalize understanding and are comfortable with this plan.  Outpatient follow-up has been provided as needed. All questions have been answered.    Sierra Burch was evaluated in Emergency Department on 04/04/2019 for the symptoms described in the history of  present illness. She was evaluated in the context of the global COVID-19 pandemic, which necessitated consideration that the patient might be at risk for infection with the SARS-CoV-2 virus that causes COVID-19. Institutional protocols and algorithms that pertain to the evaluation of patients at risk for COVID-19 are in a state of rapid change based on information released by regulatory bodies including the CDC and federal and state organizations. These policies and algorithms were followed during the patient's care in the ED.  Patient was seen wearing N95, face shield, gloves.    Inette Doubrava, Delice Bison, DO 04/04/19 [phone]

## 2019-04-04 NOTE — ED Notes (Signed)
Patient verbalizes understanding of discharge instructions. Opportunity for questioning and answers were provided. Armband removed by staff, pt discharged from ED in wheelchair to home.   

## 2019-04-04 NOTE — Discharge Instructions (Addendum)
I recommend close follow-up with your primary care provider either here with [place] or at [facility].  I have placed an order for home health services including a speech pathologist.  I suspect that your mother symptoms are more related to dementia rather than the 2 very small areas of acute stroke that were seen on MRI but we do recommend close follow-up with neurology as recommended during your previous visits.

## 2019-04-06 LAB — URINE CULTURE: Culture: 60000 — AB

## 2019-04-07 ENCOUNTER — Telehealth: Payer: Self-pay

## 2019-04-07 NOTE — Telephone Encounter (Signed)
Called home for [facility] ED 04/03/19  Daughter stated that pt was now a [facility].  Daughter said she would let MD know to look up in Care Everywhere to see culture

## 2019-04-07 NOTE — Progress Notes (Signed)
ED Antimicrobial Stewardship Positive Culture Follow Up   Sierra Burch is an 85 y.o. female who presented to [facility] on 04/03/2019 with a chief complaint of  Chief Complaint  Patient presents with  . Aphasia    Recent Results (from the past 720 hour(s))  Urine culture     Status: Abnormal   Collection Time: 04/02/19  1:27 PM   Specimen: Urine, Random  Result Value Ref Range Status   Specimen Description URINE, RANDOM  Final   Special Requests   Final    NONE Performed at [facility], [address], [place], [place] [address]    Culture 60,000 COLONIES/mL PROTEUS MIRABILIS (A)  Final   Report Status 04/06/2019 FINAL  Final   Organism ID, Bacteria PROTEUS MIRABILIS (A)  Final      Susceptibility   Proteus mirabilis - MIC*    AMPICILLIN <=2 SENSITIVE Sensitive     CEFAZOLIN <=4 SENSITIVE Sensitive     CEFTRIAXONE <=0.25 SENSITIVE Sensitive     CIPROFLOXACIN <=0.25 SENSITIVE Sensitive     GENTAMICIN <=1 SENSITIVE Sensitive     IMIPENEM 2 SENSITIVE Sensitive     NITROFURANTOIN 128 RESISTANT Resistant     TRIMETH/SULFA <=20 SENSITIVE Sensitive     AMPICILLIN/SULBACTAM <=2 SENSITIVE Sensitive     PIP/TAZO <=4 SENSITIVE Sensitive     * 60,000 COLONIES/mL PROTEUS MIRABILIS    []  Treated with N/A, organism resistant to prescribed antimicrobial [x]  Patient discharged originally without antimicrobial agent and treatment is now indicated  New antibiotic prescription: Symptom check - If pt has UTI symptoms, start cephalexin 500mg  PO BID x 5 days  ED Provider: PA-C   Daylan Juhnke, 04/07/2019, 8:36 AM Clinical Pharmacist Monday - Friday phone -  [phone] Saturday - Sunday phone - [phone]

## 2019-04-08 ENCOUNTER — Telehealth: Payer: Self-pay | Admitting: Family Medicine

## 2019-04-08 NOTE — Telephone Encounter (Signed)
Patients daughter called and requested to inform pcp that her mother is currently at [facility] due to having slurred speech and not being able to swallow properly. Patients daughter stated that she believes her mothers dementia is worsening. Patients daughter requested for home health to help with her mother over night between 5:30p-7:30a due to her working those hours. Please fu at your earliest convenience.    Patients daughter Sierra Burch requested for information on local home health agencies who could help her during those hours. Please fu at your earliest convenience.

## 2019-04-08 NOTE — Telephone Encounter (Signed)
Can you assist patient's mother with obtaining the requested home health hours and also contact daughter about speaking with the hospital's discharge planner. I am not sure if I am the PCP for this patient or if she is originally a [facility] patient from [facility]. If prior patient of K. Harris then will need hospital discharge follow-up with Dr. Earlene Plater at [facility]

## 2019-05-17 ENCOUNTER — Ambulatory Visit: Payer: Medicare Other

## 2020-12-06 IMAGING — CT CT CERVICAL SPINE WITHOUT CONTRAST
4 of 7 series · 13 of 33 positions shown, 14 images · non-contrast
Comparison: Head CT 01/05/2018.

CLINICAL DATA: Status post fall. Loss of consciousness. Initial
encounter.

EXAM:
CT HEAD WITHOUT CONTRAST
CT CERVICAL SPINE WITHOUT CONTRAST
TECHNIQUE: Multidetector CT imaging of the head and cervical spine was
performed following the standard protocol without intravenous
contrast. Multiplanar CT image reconstructions of the cervical spine
were also generated.

[Series 5: head bone · axial · 0.43mm/px · z∈[-128,-26]mm · 4 of 86 slices shown]
[im 18/86  bone]
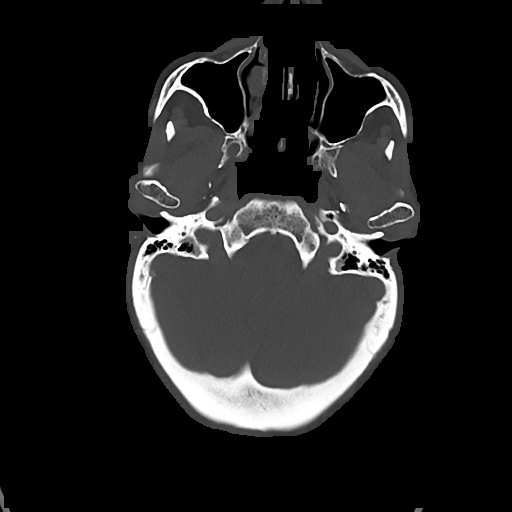
[im 35/86  bone]
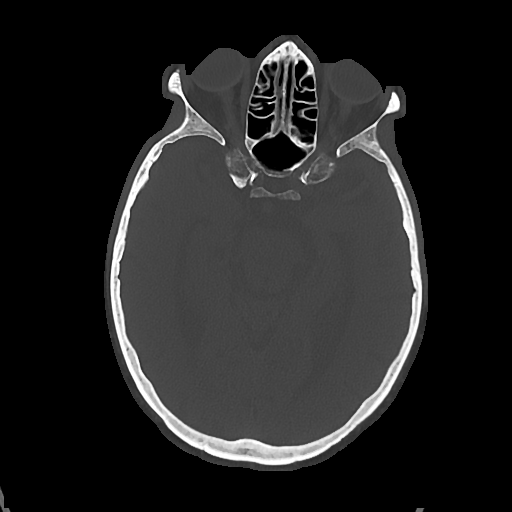
[im 52/86  bone]
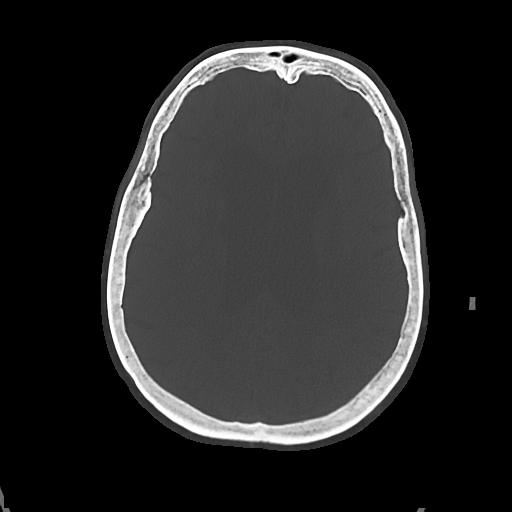
[im 69/86  bone]
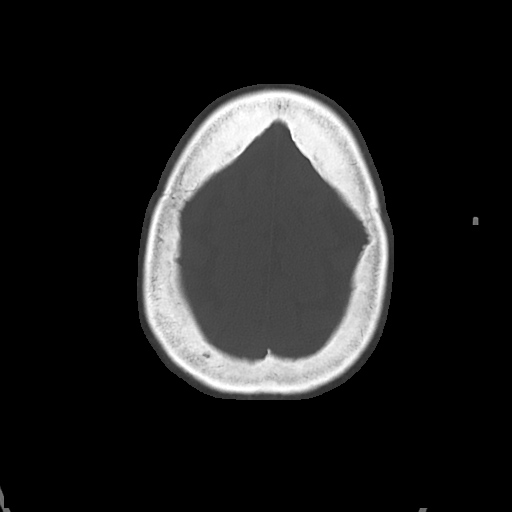

[Series 9: c spine soft · axial · 0.23mm/px · z∈[-254,-142]mm · 4 of 94 slices shown, 5 images]
[im 19/94  soft-tissue]
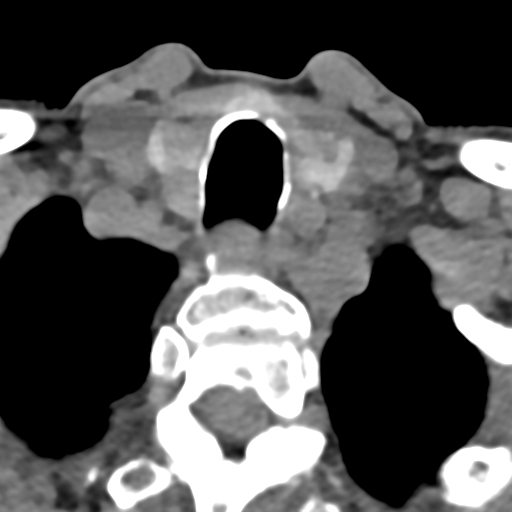
[im 19/94  bone]
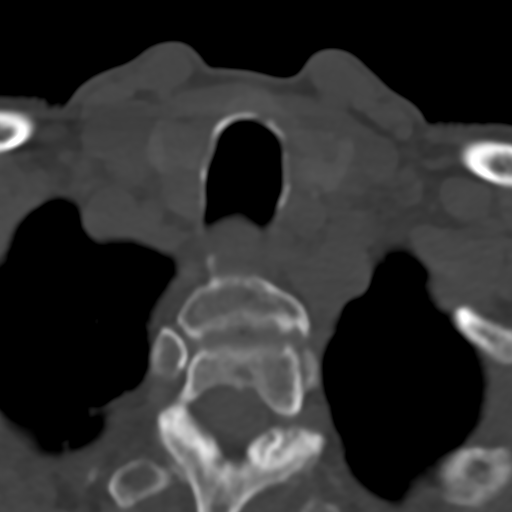
[im 38/94  bone]
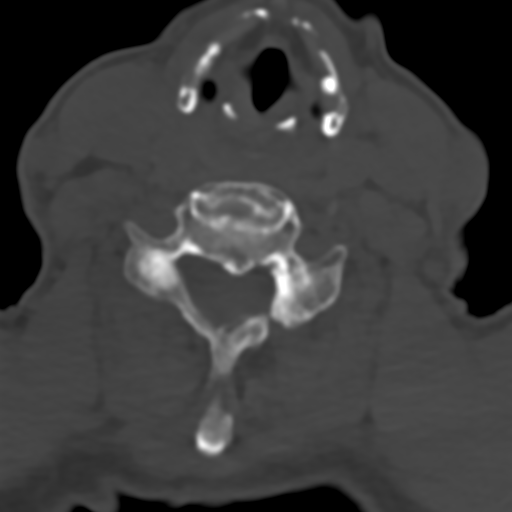
[im 56/94  bone]
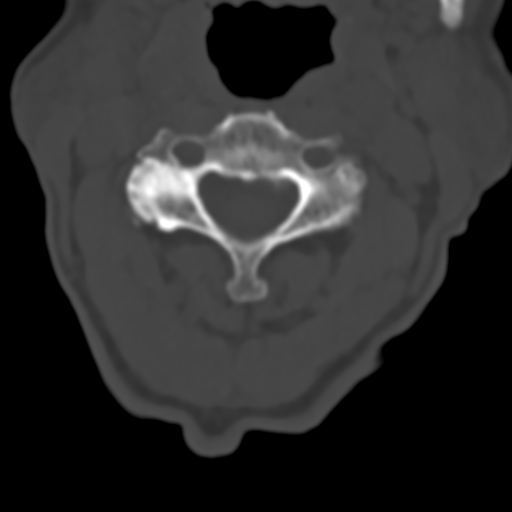
[im 75/94  bone]
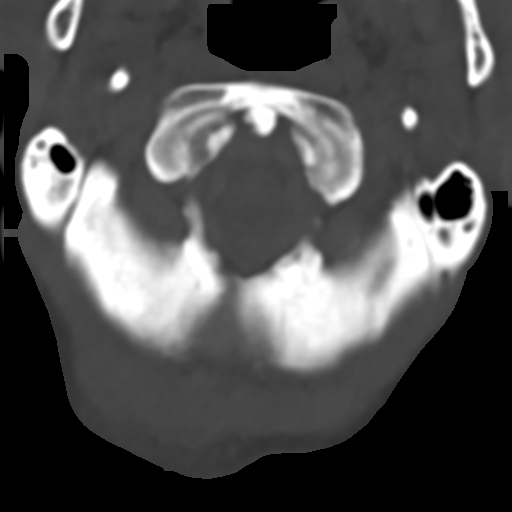

[Series 10: sag bone · sagittal · 0.20mm/px · 4 of 61 slices shown]
[im 13/61  bone]
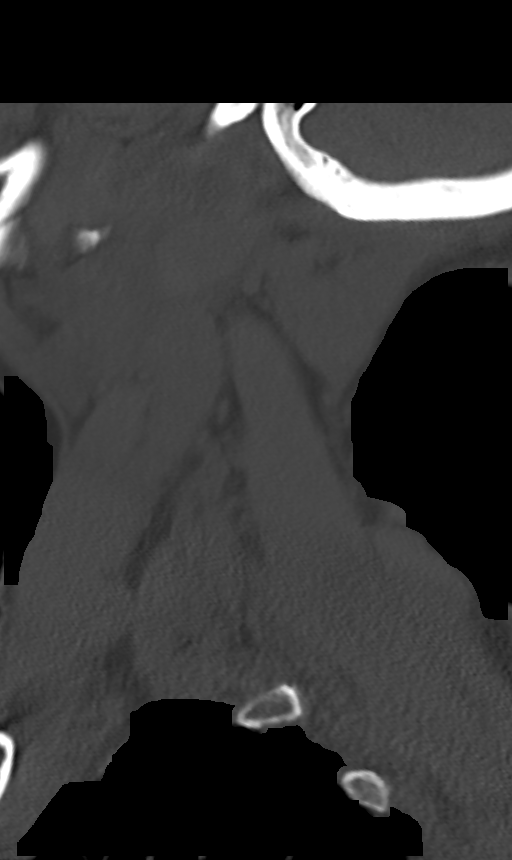
[im 25/61  bone]
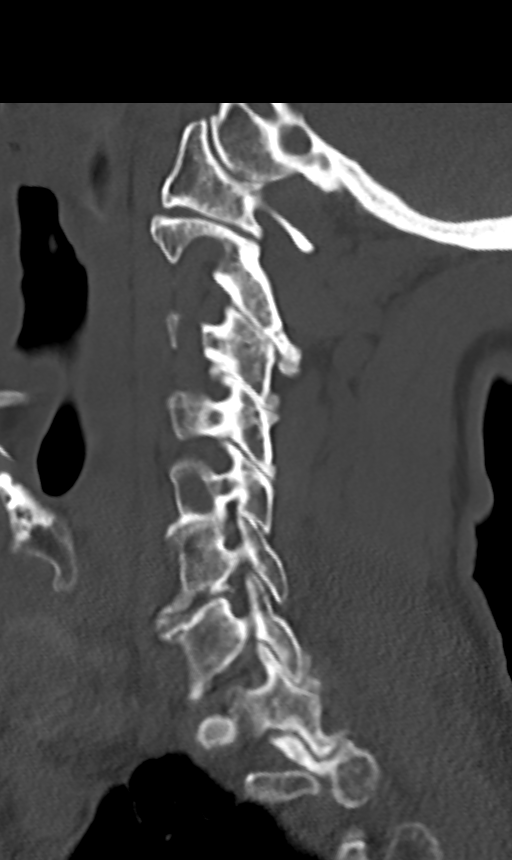
[im 37/61  bone]
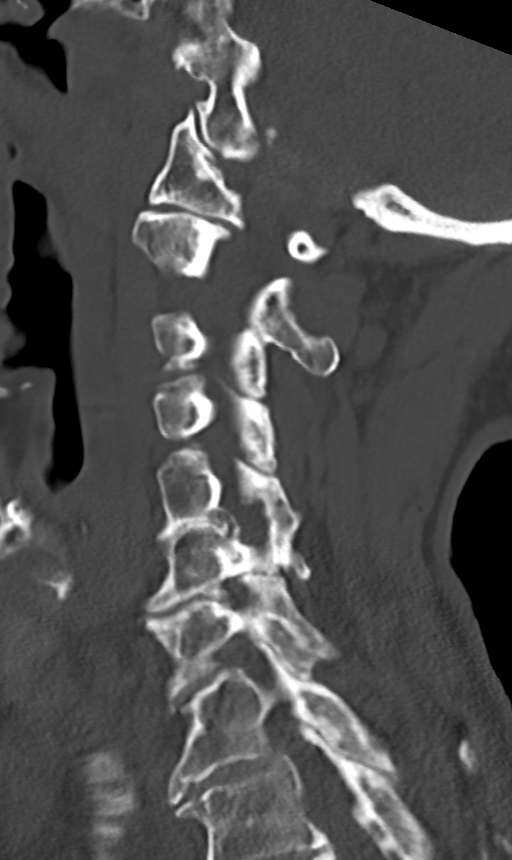
[im 49/61  bone]
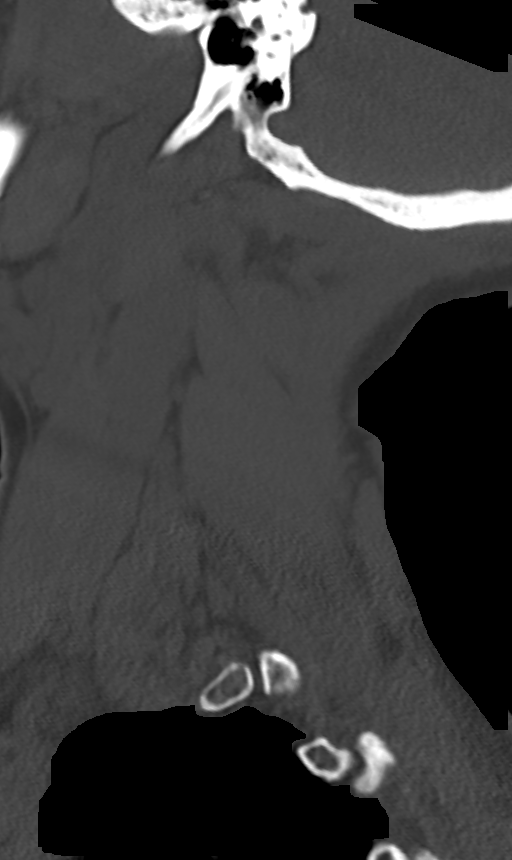

[Series 11: cor bone · coronal · 0.24mm/px · 1 of 53 slices shown]
[im 27/53  bone]
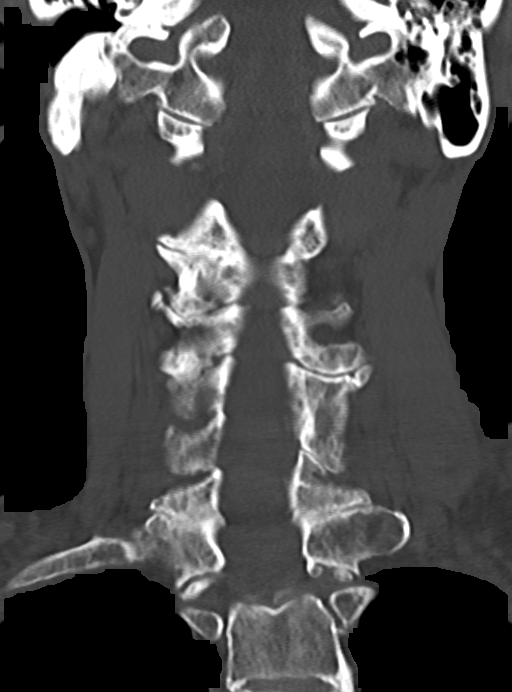

[13 of 33 positions shown; findings below may reference images not displayed]

FINDINGS: CT HEAD FINDINGS

Brain: No evidence of acute infarction, hemorrhage, hydrocephalus,
extra-axial collection or mass lesion/mass effect. Atrophy, chronic
microvascular ischemic change remote right caudate and left corona
radiata lacunar infarcts noted.

Vascular: Extensive atherosclerosis.

Skull: No fracture or focal lesion.

Sinuses/Orbits: Status post cataract surgery.  Otherwise negative.

Other: None.

CT CERVICAL SPINE FINDINGS

Alignment: Maintained.

Skull base and vertebrae: No acute fracture. No primary bone lesion
or focal pathologic process. Status post left laminotomy at C6.
Ossification of the posterior longitudinal ligament from C5-6 to
C6-7 noted.

Soft tissues and spinal canal: No prevertebral fluid or swelling. No
visible canal hematoma.

Disc levels: Loss of disc space height and endplate spurring are
worst at C5-6 and C6-7.

Upper chest: Clear.

Other: None.
IMPRESSION: No acute abnormality head or cervical spine.

Atrophy and chronic microvascular ischemic change.

C5-6 and C6-7 degenerative disease.
